# Patient Record
Sex: Female | Born: 1965 | Race: Black or African American | Hispanic: No | Marital: Married | Smoking: Former smoker
Health system: Southern US, Community
[De-identification: ages and names within clinical notes are randomized; demographics above are authoritative.]

## PROBLEM LIST (undated history)

## (undated) DIAGNOSIS — I10 Essential (primary) hypertension: Secondary | ICD-10-CM

## (undated) DIAGNOSIS — R7301 Impaired fasting glucose: Secondary | ICD-10-CM

## (undated) DIAGNOSIS — E079 Disorder of thyroid, unspecified: Secondary | ICD-10-CM

## (undated) DIAGNOSIS — R920 Mammographic microcalcification found on diagnostic imaging of breast: Secondary | ICD-10-CM

## (undated) DIAGNOSIS — E785 Hyperlipidemia, unspecified: Secondary | ICD-10-CM

## (undated) HISTORY — PX: BREAST EXCISIONAL BIOPSY: SUR124

## (undated) HISTORY — DX: Impaired fasting glucose: R73.01

## (undated) HISTORY — DX: Disorder of thyroid, unspecified: E07.9

## (undated) HISTORY — DX: Hyperlipidemia, unspecified: E78.5

## (undated) HISTORY — DX: Essential (primary) hypertension: I10

## (undated) HISTORY — PX: OTHER SURGICAL HISTORY: SHX169

## (undated) HISTORY — DX: Mammographic microcalcification found on diagnostic imaging of breast: R92.0

---

## 2002-12-14 DIAGNOSIS — I10 Essential (primary) hypertension: Secondary | ICD-10-CM

## 2002-12-14 HISTORY — DX: Essential (primary) hypertension: I10

## 2010-12-14 DIAGNOSIS — R920 Mammographic microcalcification found on diagnostic imaging of breast: Secondary | ICD-10-CM

## 2010-12-14 HISTORY — DX: Mammographic microcalcification found on diagnostic imaging of breast: R92.0

## 2011-11-02 ENCOUNTER — Ambulatory Visit: Payer: Self-pay | Admitting: Nephrology

## 2011-11-18 ENCOUNTER — Ambulatory Visit: Payer: Self-pay | Admitting: Nephrology

## 2012-06-07 ENCOUNTER — Ambulatory Visit: Payer: Self-pay | Admitting: General Surgery

## 2012-12-21 ENCOUNTER — Ambulatory Visit: Payer: Self-pay | Admitting: General Surgery

## 2013-04-17 ENCOUNTER — Encounter: Payer: Self-pay | Admitting: *Deleted

## 2013-06-28 ENCOUNTER — Ambulatory Visit: Payer: Self-pay | Admitting: General Surgery

## 2013-07-06 ENCOUNTER — Ambulatory Visit: Payer: Self-pay | Admitting: General Surgery

## 2013-07-20 ENCOUNTER — Ambulatory Visit: Payer: Self-pay | Admitting: General Surgery

## 2013-07-24 ENCOUNTER — Encounter: Payer: Self-pay | Admitting: General Surgery

## 2013-07-24 ENCOUNTER — Ambulatory Visit: Payer: Self-pay | Admitting: General Surgery

## 2013-08-07 ENCOUNTER — Ambulatory Visit: Payer: Self-pay | Admitting: General Surgery

## 2014-10-15 ENCOUNTER — Encounter: Payer: Self-pay | Admitting: *Deleted

## 2014-11-06 ENCOUNTER — Ambulatory Visit: Payer: Self-pay

## 2015-07-06 ENCOUNTER — Other Ambulatory Visit: Payer: Self-pay | Admitting: Unknown Physician Specialty

## 2015-09-20 ENCOUNTER — Other Ambulatory Visit: Payer: Self-pay | Admitting: Unknown Physician Specialty

## 2015-09-20 NOTE — Telephone Encounter (Deleted)
I need a pharmacy for this

## 2015-09-25 ENCOUNTER — Other Ambulatory Visit: Payer: Self-pay

## 2015-09-25 DIAGNOSIS — E038 Other specified hypothyroidism: Secondary | ICD-10-CM

## 2015-09-25 DIAGNOSIS — R7301 Impaired fasting glucose: Secondary | ICD-10-CM | POA: Insufficient documentation

## 2015-09-25 DIAGNOSIS — I129 Hypertensive chronic kidney disease with stage 1 through stage 4 chronic kidney disease, or unspecified chronic kidney disease: Secondary | ICD-10-CM | POA: Insufficient documentation

## 2015-09-25 DIAGNOSIS — I10 Essential (primary) hypertension: Secondary | ICD-10-CM

## 2015-09-25 DIAGNOSIS — E785 Hyperlipidemia, unspecified: Secondary | ICD-10-CM

## 2015-09-25 DIAGNOSIS — E669 Obesity, unspecified: Secondary | ICD-10-CM

## 2015-09-25 DIAGNOSIS — E039 Hypothyroidism, unspecified: Secondary | ICD-10-CM

## 2015-09-25 DIAGNOSIS — E1122 Type 2 diabetes mellitus with diabetic chronic kidney disease: Secondary | ICD-10-CM | POA: Insufficient documentation

## 2015-09-25 DIAGNOSIS — Z780 Asymptomatic menopausal state: Secondary | ICD-10-CM | POA: Insufficient documentation

## 2015-09-25 NOTE — Telephone Encounter (Signed)
Patient has appointment scheduled for 09/27/15 and is not due for a refill on the medication that was requested until May 2017.

## 2015-09-25 NOTE — Telephone Encounter (Signed)
Called and left patient a voicemail because we got a refill request from [facility] but I do not have that as her preferred pharmacy. So I called to see which one she uses. Asked patient to please call me back to let me know.

## 2015-09-27 ENCOUNTER — Encounter: Payer: Self-pay | Admitting: Unknown Physician Specialty

## 2015-09-27 ENCOUNTER — Ambulatory Visit (INDEPENDENT_AMBULATORY_CARE_PROVIDER_SITE_OTHER): Payer: Managed Care, Other (non HMO) | Admitting: Unknown Physician Specialty

## 2015-09-27 VITALS — BP 142/90 | HR 67 | Temp 98.1°F | Ht 62.72 in | Wt 194.2 lb

## 2015-09-27 DIAGNOSIS — I10 Essential (primary) hypertension: Secondary | ICD-10-CM | POA: Diagnosis not present

## 2015-09-27 DIAGNOSIS — E669 Obesity, unspecified: Secondary | ICD-10-CM | POA: Diagnosis not present

## 2015-09-27 DIAGNOSIS — R7301 Impaired fasting glucose: Secondary | ICD-10-CM

## 2015-09-27 DIAGNOSIS — N951 Menopausal and female climacteric states: Secondary | ICD-10-CM | POA: Diagnosis not present

## 2015-09-27 DIAGNOSIS — Z0000 Encounter for general adult medical examination without abnormal findings: Secondary | ICD-10-CM | POA: Diagnosis not present

## 2015-09-27 LAB — BAYER DCA HB A1C WAIVED: HB A1C (BAYER DCA - WAIVED): 6.3 % (ref <7.0)

## 2015-09-27 LAB — MICROALBUMIN, URINE WAIVED
Creatinine, Urine Waived: 50 mg/dL (ref 10–300)
MICROALB, UR WAIVED: 10 mg/L (ref 0–19)
Microalb/Creat Ratio: <30 mg/g (ref <30)

## 2015-09-27 NOTE — Assessment & Plan Note (Addendum)
Borderline high today.  Will work on lifestyle changes

## 2015-09-27 NOTE — Assessment & Plan Note (Addendum)
Weight watchers.  Discussed exercise.  Handout HITT training.  Form filled out for [employer] for a 15 pound weight loss in 6 months

## 2015-09-27 NOTE — Progress Notes (Signed)
BP 142/90 mmHg  Pulse 67  Temp(Src) 98.1 F (36.7 C)  Ht 5' 2.72" (1.593 m)  Wt 194 lb 3.2 oz (88.089 kg)  BMI 34.71 kg/m2  SpO2 100%  LMP 09/13/2015   Subjective:    Patient ID: Sherry Patel, female    DOB: 06/30/1966, 49 y.o.   MRN: [id number]  HPI: Sherry Patel is a 49 y.o. female  Chief Complaint  Patient presents with  . Annual Exam   Hypertension This is a chronic (High today.  Was rushed and doesn't check in on a regular basis) problem. Associated symptoms include malaise/fatigue. Pertinent negatives include no anxiety, blurred vision, chest pain, headaches, neck pain, orthopnea, palpitations, peripheral edema, PND, shortness of breath or sweats. There are no compliance problems.   Hyperlipidemia This is a chronic problem. There are no known factors aggravating her hyperlipidemia. Pertinent negatives include no chest pain or shortness of breath. The current treatment provides no improvement of lipids. There are no compliance problems.    IFG/obesity Recent weight gain.  Planning on going to weight watchers.  Has a DASH eating plan but needs to look at it.     Relevant past medical, surgical, family and social history reviewed and updated as indicated. Interim medical history since our last visit reviewed. Allergies and medications reviewed and updated.  Review of Systems  Constitutional: Positive for malaise/fatigue.  Eyes: Negative for blurred vision.  Respiratory: Negative for shortness of breath.   Cardiovascular: Negative for chest pain, palpitations, orthopnea and PND.  Genitourinary:       Periods off and on.  Peri-menopausal symptoms.  Musculoskeletal: Negative for neck pain.  Neurological: Negative for headaches.  Psychiatric/Behavioral: Negative.    Depression screen [facility] 2/9 09/27/2015  Decreased Interest 0  Down, Depressed, Hopeless 0  PHQ - 2 Score 0     Per HPI unless specifically indicated above     Objective:    BP 142/90 mmHg  Pulse  67  Temp(Src) 98.1 F (36.7 C)  Ht 5' 2.72" (1.593 m)  Wt 194 lb 3.2 oz (88.089 kg)  BMI 34.71 kg/m2  SpO2 100%  LMP 09/13/2015  Wt Readings from Last 3 Encounters:  09/27/15 194 lb 3.2 oz (88.089 kg)    Physical Exam  Constitutional: She is oriented to person, place, and time. She appears well-developed and well-nourished.  HENT:  Head: Normocephalic and atraumatic.  Eyes: Pupils are equal, round, and reactive to light. Right eye exhibits no discharge. Left eye exhibits no discharge. No scleral icterus.  Neck: Normal range of motion. Neck supple. Carotid bruit is not present. No thyromegaly present.  Cardiovascular: Normal rate, regular rhythm and normal heart sounds.  Exam reveals no gallop and no friction rub.   No murmur heard. Pulmonary/Chest: Effort normal and breath sounds normal. No respiratory distress. She has no wheezes. She has no rales.  Abdominal: Soft. Bowel sounds are normal. There is no tenderness. There is no rebound.  Genitourinary: No breast swelling, tenderness or discharge.  Musculoskeletal: Normal range of motion.  Lymphadenopathy:    She has no cervical adenopathy.  Neurological: She is alert and oriented to person, place, and time.  Skin: Skin is warm, dry and intact. No rash noted.  Psychiatric: She has a normal mood and affect. Her speech is normal and behavior is normal. Judgment and thought content normal. Cognition and memory are normal.    No results found for this or any previous visit.    Assessment & Plan:  Problem List Items Addressed This Visit      Unprioritized   Hypertension - Primary    Borderline high today.  Will work on lifestyle changes      Relevant Orders   Comprehensive metabolic panel   Microalbumin, Urine Waived   Uric acid   IFG (impaired fasting glucose)   Relevant Orders   Bayer DCA Hb A1c Waived   Obesity    Weight watchers.  Discussed exercise.  Handout HITT training      Relevant Orders   Bayer DCA Hb A1c  Waived   Peri-menopause    Other Visit Diagnoses    Routine general medical examination at a health care facility        Relevant Orders    CBC with Differential/Platelet    Comprehensive metabolic panel    Hepatitis C antibody    HIV antibody    Lipid Panel w/o Chol/HDL Ratio    TSH    Bayer DCA Hb A1c Waived    Ambulatory referral to Gastroenterology        Follow up plan: Return in about 3 months (around 12/28/2015).

## 2015-09-27 NOTE — Patient Instructions (Signed)
Think you're too busy to work out? We have the workout for you. In minutes, high-intensity interval training (H.I.I.T.) will have you sweating, breathing hard and maximizing the health benefits of exercise without the time commitment. Best of all, it's scientifically proven to work.  What Is H.I.I.T.? SHORT WORKOUTS 101 High-intensity interval training - referred to as H.I.I.T. - is based on the idea that short bursts of strenuous exercise can have a big impact on the body. If moderate exercise - like a 20-minute jog - is good for your heart, lungs and metabolism, H.I.I.T. packs the benefits of that workout and more into a few minutes. It may sound too good to be true, but learning this exercise technique and adapting it to your life can mean saving hours at the gym. If you think you don't have time to exercise, H.I.I.T. may be the workout for you.  You can try it with any aerobic activity you like. The principles of H.I.I.T. can be applied to running, biking, stair climbing, swimming, jumping rope, rowing, even hopping or skipping. (Yes, skipping!)  The downside? Even though H.I.I.T. lasts only minutes, the workouts are tough, requiring you to push your body near its limit.  HOW INTENSE IS HIGH INTENSITY? High-intensity exercise is obviously not a casual stroll down the street, but it's not a run-till-your-lungs-pop explosion, either. Think breathless, not winded. Heart-pounding, not exploding. Legs pumping, but not uncontrolled.  You don't need any fancy heart rate monitors to do these workouts. Use cues from your body as a guide. In the middle of a high-intensity workout you should be able to say single words, but not complete whole sentences. So, if you can keep chatting to your workout partner during this workout, pump it up a few notches.  10-02-29 Training This simple program will help you make the most of a short workout by improving heart health and endurance. Try it with your favorite  cardiovascular activity. The essentials of 10-02-29 training are simple. Run, ride or perhaps row on a rowing machine gently for 30 seconds, accelerate to a moderate pace for 20 seconds, then sprint as hard as you can for 10 seconds. (It should be called 30-20-10 training, obviously, but that is not as catchy.) Repeat.  You don't even need a stopwatch to monitor the 30-, 20-, and 10-second time changes. You can just count to yourself, which seems to make the intervals pass more quickly.  Best of all? The grueling, all-out portion of the workout lasts for only 10 seconds. C'mon, you can do anything for 10 seconds, right?  Got 10 Minutes? A solitary minute of hard work buried in 10 minutes of activity can make a big difference.  The 10-Minute Workout If you like to run, bike, row or swim - just a little bit - this workout is a great option for you. Step 1 Warm up for 2 minutes Step 2 Pedal, run or swim all-out for 20 seconds. Repeat 2 more times Warm down for 3 minutes    GET STARTED To benefit the most from really, really short workouts, you need to build the habit of doing them into your hectic life. Ideally, you'll complete the workout three times a week. The best way to build that habit is to start small and be willing to tweak your schedule where you can to accommodate your new workout.  First set up a spot in your house for your workout, equipped with whatever you need to get the job done: sneakers, a   chair, a towel, etc. Then slot your workout in before you would normally shower. (You can even do it in the bathroom.) Or wake up five minutes earlier and do it first thing in the morning, so you can head off to work feeling accomplished. Or do it during your lunch hour. Run up your office's stairs or grab a private conference room for just a few minutes. Or work it into your commute. If you walk or bike to work, add some heavy intervals on the way home.  GET A BOOST FROM MUSIC Creating a  workout playlist of high-energy tunes you love will not make your workout feel easier, but it may cause you to exercise harder without even realizing it. Best of all, if you are doing a really short workout, you need only one or two great tunes to get you through. If you are willing to try something a bit different, make your own music as you exercise. Sing, hum, clap your hands, whatever you can do to jam along to your playlist. It may give you an extra boost to finish strong.  Find a song or podcast that's the length of your really, really short workout. By the time the song is over, you're done.  Excerpted from the [place] Times Well column http://www.nytimes.com/well/guides/really-really-short-workouts?smid=fb-nytwell&smtyp=pay  

## 2015-09-28 LAB — CBC WITH DIFFERENTIAL/PLATELET
BASOS ABS: 0 10*3/uL (ref 0.0–0.2)
Basos: 0 %
EOS (ABSOLUTE): 0.2 10*3/uL (ref 0.0–0.4)
Eos: 2 %
HEMOGLOBIN: 12.4 g/dL (ref 11.1–15.9)
Hematocrit: 37.9 % (ref 34.0–46.6)
Immature Grans (Abs): 0 10*3/uL (ref 0.0–0.1)
Immature Granulocytes: 0 %
LYMPHS ABS: 2.6 10*3/uL (ref 0.7–3.1)
Lymphs: 32 %
MCH: 28.8 pg (ref 26.6–33.0)
MCHC: 32.7 g/dL (ref 31.5–35.7)
MCV: 88 fL (ref 79–97)
MONOCYTES: 6 %
MONOS ABS: 0.5 10*3/uL (ref 0.1–0.9)
Neutrophils Absolute: 4.8 10*3/uL (ref 1.4–7.0)
Neutrophils: 60 %
PLATELETS: 381 10*3/uL — AB (ref 150–379)
RBC: 4.3 x10E6/uL (ref 3.77–5.28)
RDW: 14.9 % (ref 12.3–15.4)
WBC: 8.1 10*3/uL (ref 3.4–10.8)

## 2015-09-28 LAB — COMPREHENSIVE METABOLIC PANEL
ALK PHOS: 73 IU/L (ref 39–117)
ALT: 12 IU/L (ref 0–32)
AST: 13 IU/L (ref 0–40)
Albumin/Globulin Ratio: 1.2 (ref 1.1–2.5)
Albumin: 4.3 g/dL (ref 3.5–5.5)
BILIRUBIN TOTAL: 0.3 mg/dL (ref 0.0–1.2)
BUN/Creatinine Ratio: 17 (ref 9–23)
BUN: 10 mg/dL (ref 6–24)
CHLORIDE: 97 mmol/L (ref 97–108)
CO2: 23 mmol/L (ref 18–29)
CREATININE: 0.59 mg/dL (ref 0.57–1.00)
Calcium: 10 mg/dL (ref 8.7–10.2)
GFR calc Af Amer: 123 mL/min/{1.73_m2} (ref >59)
GFR calc non Af Amer: 106 mL/min/{1.73_m2} (ref >59)
GLUCOSE: 80 mg/dL (ref 65–99)
Globulin, Total: 3.5 g/dL (ref 1.5–4.5)
Potassium: 4.2 mmol/L (ref 3.5–5.2)
Sodium: 139 mmol/L (ref 134–144)
Total Protein: 7.8 g/dL (ref 6.0–8.5)

## 2015-09-28 LAB — LIPID PANEL W/O CHOL/HDL RATIO
Cholesterol, Total: 194 mg/dL (ref 100–199)
HDL: 49 mg/dL (ref >39)
LDL Calculated: 127 mg/dL — ABNORMAL HIGH (ref 0–99)
TRIGLYCERIDES: 88 mg/dL (ref 0–149)
VLDL Cholesterol Cal: 18 mg/dL (ref 5–40)

## 2015-09-28 LAB — TSH: TSH: 8.56 u[IU]/mL — ABNORMAL HIGH (ref 0.450–4.500)

## 2015-09-28 LAB — URIC ACID: URIC ACID: 6.6 mg/dL (ref 2.5–7.1)

## 2015-09-28 LAB — HEPATITIS C ANTIBODY: HEP C VIRUS AB: 0.2 {s_co_ratio} (ref 0.0–0.9)

## 2015-09-28 LAB — HIV ANTIBODY (ROUTINE TESTING W REFLEX): HIV SCREEN 4TH GENERATION: NONREACTIVE

## 2015-10-01 ENCOUNTER — Other Ambulatory Visit: Payer: Self-pay | Admitting: Unknown Physician Specialty

## 2015-10-01 DIAGNOSIS — E039 Hypothyroidism, unspecified: Secondary | ICD-10-CM

## 2015-10-01 MED ORDER — LEVOTHYROXINE SODIUM 50 MCG PO TABS: 50.0000 ug | ORAL_TABLET | Freq: Every day | ORAL | Status: DC

## 2015-10-03 ENCOUNTER — Telehealth: Payer: Self-pay | Admitting: Unknown Physician Specialty

## 2015-10-03 MED ORDER — LEVOTHYROXINE SODIUM 50 MCG PO TABS: 50.0000 ug | ORAL_TABLET | Freq: Every day | ORAL | Status: DC

## 2015-10-03 NOTE — Telephone Encounter (Signed)
Pt calls and stated that [facility] has not received her rx for her synthroid and she called to verify we have the right fax number([phone])

## 2015-10-03 NOTE — Telephone Encounter (Signed)
Routing to provider. Can you e-prescribe this since you are not here to sign for it? Pharmacy is [facility].

## 2015-10-04 NOTE — Telephone Encounter (Signed)
Rx was printed again but Elnita MaxwellCheryl is not here to sign it. So I called the rx into the pharmacy.

## 2015-10-28 ENCOUNTER — Other Ambulatory Visit: Payer: Self-pay

## 2015-10-28 MED ORDER — AMLODIPINE BESYLATE 2.5 MG PO TABS: 2.5000 mg | ORAL_TABLET | Freq: Every day | ORAL | Status: DC

## 2015-10-28 NOTE — Telephone Encounter (Signed)
LAST VISIT: 09/27/2015 NEXT APPT: 12/31/2015  Request for Amlodipine 2.5 mg

## 2015-11-22 ENCOUNTER — Other Ambulatory Visit: Payer: Self-pay | Admitting: Unknown Physician Specialty

## 2015-12-31 ENCOUNTER — Ambulatory Visit (INDEPENDENT_AMBULATORY_CARE_PROVIDER_SITE_OTHER): Payer: Managed Care, Other (non HMO) | Admitting: Unknown Physician Specialty

## 2015-12-31 ENCOUNTER — Encounter: Payer: Self-pay | Admitting: Unknown Physician Specialty

## 2015-12-31 VITALS — BP 132/87 | HR 54 | Temp 98.2°F | Ht 62.1 in | Wt 198.2 lb

## 2015-12-31 DIAGNOSIS — I10 Essential (primary) hypertension: Secondary | ICD-10-CM

## 2015-12-31 DIAGNOSIS — E039 Hypothyroidism, unspecified: Secondary | ICD-10-CM | POA: Diagnosis not present

## 2015-12-31 NOTE — Progress Notes (Signed)
BP 132/87 mmHg  Pulse 54  Temp(Src) 98.2 F (36.8 C)  Ht 5' 2.1" (1.577 m)  Wt 198 lb 3.2 oz (89.903 kg)  BMI 36.15 kg/m2  SpO2 100%  LMP 10/31/2015 (Approximate)   Subjective:    Patient ID: Sherry Patel, female    DOB: 03/24/1966, 50 y.o.   MRN: [id number]  HPI: Sherry Patel is a 50 y.o. female  Chief Complaint  Patient presents with  . Hypertension  . Hyperlipidemia  . Hypothyroidism   Hypothyroid Strted thyroid meds after last labs.  Fatigue seems to be better.  Struggling with weight and unable to lose.  Thinks her thyroid medicine isn't working for her metabolism.  Hypertension Better today than in the past.    Relevant past medical, surgical, family and social history reviewed and updated as indicated. Interim medical history since our last visit reviewed. Allergies and medications reviewed and updated.  Review of Systems  Per HPI unless specifically indicated above     Objective:    BP 132/87 mmHg  Pulse 54  Temp(Src) 98.2 F (36.8 C)  Ht 5' 2.1" (1.577 m)  Wt 198 lb 3.2 oz (89.903 kg)  BMI 36.15 kg/m2  SpO2 100%  LMP 10/31/2015 (Approximate)  Wt Readings from Last 3 Encounters:  12/31/15 198 lb 3.2 oz (89.903 kg)  09/27/15 194 lb 3.2 oz (88.089 kg)    Physical Exam  Constitutional: She is oriented to person, place, and time. She appears well-developed and well-nourished. No distress.  HENT:  Head: Normocephalic and atraumatic.  Eyes: Conjunctivae and lids are normal. Right eye exhibits no discharge. Left eye exhibits no discharge. No scleral icterus.  Neck: Normal range of motion. Neck supple. No JVD present. Carotid bruit is not present.  Cardiovascular: Normal rate, regular rhythm and normal heart sounds.   Pulmonary/Chest: Effort normal and breath sounds normal.  Abdominal: Normal appearance. There is no splenomegaly or hepatomegaly.  Musculoskeletal: Normal range of motion.  Neurological: She is alert and oriented to person, place, and  time.  Skin: Skin is warm, dry and intact. No rash noted. No pallor.  Psychiatric: She has a normal mood and affect. Her behavior is normal. Judgment and thought content normal.    Results for orders placed or performed in visit on 09/27/15  CBC with Differential/Platelet  Result Value Ref Range   WBC 8.1 3.4 - 10.8 x10E3/uL   RBC 4.30 3.77 - 5.28 x10E6/uL   Hemoglobin 12.4 11.1 - 15.9 g/dL   Hematocrit 52.8 41.3 - 46.6 %   MCV 88 79 - 97 fL   MCH 28.8 26.6 - 33.0 pg   MCHC 32.7 31.5 - 35.7 g/dL   RDW 24.4 01.0 - 27.2 %   Platelets 381 (H) 150 - 379 x10E3/uL   Neutrophils 60 %   Lymphs 32 %   Monocytes 6 %   Eos 2 %   Basos 0 %   Neutrophils Absolute 4.8 1.4 - 7.0 x10E3/uL   Lymphocytes Absolute 2.6 0.7 - 3.1 x10E3/uL   Monocytes Absolute 0.5 0.1 - 0.9 x10E3/uL   EOS (ABSOLUTE) 0.2 0.0 - 0.4 x10E3/uL   Basophils Absolute 0.0 0.0 - 0.2 x10E3/uL   Immature Granulocytes 0 %   Immature Grans (Abs) 0.0 0.0 - 0.1 x10E3/uL  Comprehensive metabolic panel  Result Value Ref Range   Glucose 80 65 - 99 mg/dL   BUN 10 6 - 24 mg/dL   Creatinine, Ser 5.36 0.57 - 1.00 mg/dL   GFR calc  non Af Amer 106 >59 mL/min/1.73   GFR calc Af Amer 123 >59 mL/min/1.73   BUN/Creatinine Ratio 17 9 - 23   Sodium 139 134 - 144 mmol/L   Potassium 4.2 3.5 - 5.2 mmol/L   Chloride 97 97 - 108 mmol/L   CO2 23 18 - 29 mmol/L   Calcium 10.0 8.7 - 10.2 mg/dL   Total Protein 7.8 6.0 - 8.5 g/dL   Albumin 4.3 3.5 - 5.5 g/dL   Globulin, Total 3.5 1.5 - 4.5 g/dL   Albumin/Globulin Ratio 1.2 1.1 - 2.5   Bilirubin Total 0.3 0.0 - 1.2 mg/dL   Alkaline Phosphatase 73 39 - 117 IU/L   AST 13 0 - 40 IU/L   ALT 12 0 - 32 IU/L  Hepatitis C antibody  Result Value Ref Range   Hep C Virus Ab 0.2 0.0 - 0.9 s/co ratio  HIV antibody  Result Value Ref Range   HIV Screen 4th Generation wRfx Non Reactive Non Reactive  Lipid Panel w/o Chol/HDL Ratio  Result Value Ref Range   Cholesterol, Total 194 100 - 199 mg/dL    Triglycerides 88 0 - 149 mg/dL   HDL 49 >16 mg/dL   VLDL Cholesterol Cal 18 5 - 40 mg/dL   LDL Calculated 109 (H) 0 - 99 mg/dL  TSH  Result Value Ref Range   TSH 8.560 (H) 0.450 - 4.500 uIU/mL  Bayer DCA Hb A1c Waived  Result Value Ref Range   Bayer DCA Hb A1c Waived 6.3 <7.0 %  Microalbumin, Urine Waived  Result Value Ref Range   Microalb, Ur Waived 10 0 - 19 mg/L   Creatinine, Urine Waived 50 10 - 300 mg/dL   Microalb/Creat Ratio <30 <30 mg/g  Uric acid  Result Value Ref Range   Uric Acid 6.6 2.5 - 7.1 mg/dL      Assessment & Plan:   Problem List Items Addressed This Visit      Unprioritized   Hypertension - Primary    Stable today      Thyroid activity decreased    Check TSH today          Follow up plan: Return in about 3 months (around 03/30/2016).

## 2015-12-31 NOTE — Assessment & Plan Note (Signed)
Check TSH today

## 2015-12-31 NOTE — Assessment & Plan Note (Signed)
Stable today.

## 2016-01-01 ENCOUNTER — Telehealth: Payer: Self-pay | Admitting: Unknown Physician Specialty

## 2016-01-01 ENCOUNTER — Other Ambulatory Visit: Payer: Self-pay | Admitting: Unknown Physician Specialty

## 2016-01-01 LAB — TSH: TSH: 2.37 u[IU]/mL (ref 0.450–4.500)

## 2016-01-01 MED ORDER — LEVOTHYROXINE SODIUM 50 MCG PO TABS: 50.0000 ug | ORAL_TABLET | Freq: Every day | ORAL | Status: DC

## 2016-01-01 NOTE — Telephone Encounter (Signed)
Patient wants to know if she can add mental pause supplement with her medication and a multivitamin, please call patient back, thanks.

## 2016-01-01 NOTE — Telephone Encounter (Signed)
Called and spoke to patient. She states she is wanting to start taking a multivitamin and a supplement for menopause called [facility] if [facility] approves.

## 2016-01-01 NOTE — Telephone Encounter (Signed)
Routing to provider. Is it okay for patient to add medications?

## 2016-01-01 NOTE — Telephone Encounter (Signed)
I need to be sure of this message.  Does she mean menopause?  And I need to know what is the supplement

## 2016-01-02 NOTE — Telephone Encounter (Signed)
That would be fine!  Thanks! 

## 2016-01-02 NOTE — Telephone Encounter (Signed)
Called and let patient know that Sherry Patel said it would be okay for her to start taking the supplements.

## 2016-02-04 ENCOUNTER — Other Ambulatory Visit: Payer: Self-pay | Admitting: Unknown Physician Specialty

## 2016-02-04 MED ORDER — ATORVASTATIN CALCIUM 10 MG PO TABS: 10.0000 mg | ORAL_TABLET | Freq: Every day | ORAL | Status: DC

## 2016-02-04 NOTE — Telephone Encounter (Signed)
metoprolol succinate (TOPROL-XL) 100 MG 24 hr tablet  Patient needs refill on her medication sent to [facility] [address].

## 2016-02-04 NOTE — Telephone Encounter (Signed)
Pt also needs atorvastatin sent to [facility] [address]

## 2016-02-04 NOTE — Telephone Encounter (Signed)
Called and spoke to patient. She states she needs a short amount of her atorvastatin and metoprolol sent to [facility] until her mail order prescriptions come in.

## 2016-02-04 NOTE — Telephone Encounter (Signed)
Called medications into pharmacy.

## 2016-02-04 NOTE — Telephone Encounter (Signed)
Please call these in if she wants [address].  For some reason the EMR is making me send them to [facility].

## 2016-02-05 ENCOUNTER — Telehealth: Payer: Self-pay | Admitting: Unknown Physician Specialty

## 2016-02-05 NOTE — Telephone Encounter (Signed)
That should be OK

## 2016-02-05 NOTE — Telephone Encounter (Signed)
Patient needs to talk to [facility] or Elnita Maxwell regarding medications. She has questions about what she is taking and wants to take. Please call patient at [phone] or work [phone], thanks.

## 2016-02-05 NOTE — Telephone Encounter (Signed)
Called and let patient know that Elnita Maxwell said it would be ok for her to take the supplement.

## 2016-02-05 NOTE — Telephone Encounter (Signed)
Called and spoke to patient. She wants to know if she can take a supplement called [facility] for premenopausal symptoms.

## 2016-03-19 ENCOUNTER — Other Ambulatory Visit: Payer: Self-pay | Admitting: Unknown Physician Specialty

## 2016-03-31 ENCOUNTER — Ambulatory Visit: Payer: Managed Care, Other (non HMO) | Admitting: Unknown Physician Specialty

## 2016-04-03 ENCOUNTER — Encounter: Payer: Self-pay | Admitting: Unknown Physician Specialty

## 2016-04-03 ENCOUNTER — Ambulatory Visit (INDEPENDENT_AMBULATORY_CARE_PROVIDER_SITE_OTHER): Payer: Managed Care, Other (non HMO) | Admitting: Unknown Physician Specialty

## 2016-04-03 VITALS — BP 124/80 | HR 59 | Temp 98.3°F | Ht 62.0 in | Wt 198.8 lb

## 2016-04-03 DIAGNOSIS — E785 Hyperlipidemia, unspecified: Secondary | ICD-10-CM

## 2016-04-03 DIAGNOSIS — E039 Hypothyroidism, unspecified: Secondary | ICD-10-CM

## 2016-04-03 DIAGNOSIS — I10 Essential (primary) hypertension: Secondary | ICD-10-CM | POA: Diagnosis not present

## 2016-04-03 MED ORDER — ATORVASTATIN CALCIUM 10 MG PO TABS: 10.0000 mg | ORAL_TABLET | Freq: Every day | ORAL | Status: DC

## 2016-04-03 MED ORDER — LEVOTHYROXINE SODIUM 50 MCG PO TABS: 50.0000 ug | ORAL_TABLET | Freq: Every day | ORAL | Status: DC

## 2016-04-03 NOTE — Progress Notes (Signed)
   BP 124/80 mmHg  Pulse 59  Temp(Src) 98.3 F (36.8 C)  Ht 5\' 2"  (1.575 m)  Wt 198 lb 12.8 oz (90.175 kg)  BMI 36.35 kg/m2  SpO2 98%  LMP  (LMP Unknown)   Subjective:    Patient ID: Sherry Patel, female    DOB: [birth year], 50 y.o.   MRN: [id number]  HPI: Sherry Patel is a 50 y.o. female  Chief Complaint  Patient presents with  . Hyperlipidemia  . Hypertension  . Hypothyroidism  . Medication Refill    pt states she needs levothyroxine sent to [facility]   Hypertension Using medications without difficulty   No problems or lightheadedness No chest pain with exertion or shortness of breath No Edema   Hyperlipidemia Using medications without problems No Muscle aches  Diet compliance: "starting over again" Exercise: as above  Hypothyroid Euthyroid on 50 mcgs of Synthroid  Relevant past medical, surgical, family and social history reviewed and updated as indicated. Interim medical history since our last visit reviewed. Allergies and medications reviewed and updated.  Review of Systems  Per HPI unless specifically indicated above     Objective:    BP 124/80 mmHg  Pulse 59  Temp(Src) 98.3 F (36.8 C)  Ht 5\' 2"  (1.575 m)  Wt 198 lb 12.8 oz (90.175 kg)  BMI 36.35 kg/m2  SpO2 98%  LMP  (LMP Unknown)  Wt Readings from Last 3 Encounters:  04/03/16 198 lb 12.8 oz (90.175 kg)  12/31/15 198 lb 3.2 oz (89.903 kg)  09/27/15 194 lb 3.2 oz (88.089 kg)    Physical Exam  Constitutional: She is oriented to person, place, and time. She appears well-developed and well-nourished. No distress.  HENT:  Head: Normocephalic and atraumatic.  Eyes: Conjunctivae and lids are normal. Right eye exhibits no discharge. Left eye exhibits no discharge. No scleral icterus.  Neck: Normal range of motion. Neck supple. No JVD present. Carotid bruit is not present.  Cardiovascular: Normal rate, regular rhythm and normal heart sounds.   Pulmonary/Chest: Effort normal and breath sounds normal.   Abdominal: Normal appearance. There is no splenomegaly or hepatomegaly.  Musculoskeletal: Normal range of motion.  Neurological: She is alert and oriented to person, place, and time.  Skin: Skin is warm, dry and intact. No rash noted. No pallor.  Psychiatric: She has a normal mood and affect. Her behavior is normal. Judgment and thought content normal.    Results for orders placed or performed in visit on 12/31/15  TSH  Result Value Ref Range   TSH 2.370 0.450 - 4.500 uIU/mL      Assessment & Plan:   Problem List Items Addressed This Visit      Unprioritized   Hyperlipidemia   Relevant Medications   atorvastatin (LIPITOR) 10 MG tablet   Hypertension - Primary   Relevant Medications   atorvastatin (LIPITOR) 10 MG tablet   Thyroid activity decreased   Relevant Medications   levothyroxine (SYNTHROID, LEVOTHROID) 50 MCG tablet      Diagnosis stable.  Continue present medications  Follow up plan: Return in about 6 months (around 10/03/2016).

## 2016-05-14 ENCOUNTER — Telehealth: Payer: Self-pay | Admitting: Unknown Physician Specialty

## 2016-05-14 MED ORDER — AMLODIPINE BESYLATE 2.5 MG PO TABS: 2.5000 mg | ORAL_TABLET | Freq: Every day | ORAL | Status: DC

## 2016-05-14 NOTE — Telephone Encounter (Signed)
Pt needs 2 week supply of Amlodipine called into [facility] [address]  This is to cover until she gets her mail order arrives.

## 2016-10-02 ENCOUNTER — Encounter: Payer: Self-pay | Admitting: Unknown Physician Specialty

## 2016-10-02 ENCOUNTER — Ambulatory Visit (INDEPENDENT_AMBULATORY_CARE_PROVIDER_SITE_OTHER): Payer: Managed Care, Other (non HMO) | Admitting: Unknown Physician Specialty

## 2016-10-02 VITALS — BP 136/93 | HR 57 | Temp 98.2°F | Ht 62.0 in | Wt 198.0 lb

## 2016-10-02 DIAGNOSIS — E7800 Pure hypercholesterolemia, unspecified: Secondary | ICD-10-CM

## 2016-10-02 DIAGNOSIS — I129 Hypertensive chronic kidney disease with stage 1 through stage 4 chronic kidney disease, or unspecified chronic kidney disease: Secondary | ICD-10-CM | POA: Diagnosis not present

## 2016-10-02 DIAGNOSIS — R7301 Impaired fasting glucose: Secondary | ICD-10-CM

## 2016-10-02 DIAGNOSIS — Z0000 Encounter for general adult medical examination without abnormal findings: Secondary | ICD-10-CM | POA: Diagnosis not present

## 2016-10-02 DIAGNOSIS — Z6839 Body mass index (BMI) 39.0-39.9, adult: Secondary | ICD-10-CM

## 2016-10-02 DIAGNOSIS — E6609 Other obesity due to excess calories: Secondary | ICD-10-CM

## 2016-10-02 NOTE — Patient Instructions (Signed)
Eat Fat Get Thin  The new Adkins diet

## 2016-10-02 NOTE — Progress Notes (Signed)
BP (!) 136/93   Pulse (!) 57   Temp 98.2 F (36.8 C)   Ht 5\' 2"  (1.575 m)   Wt 198 lb (89.8 kg)   BMI 36.21 kg/m    Subjective:    Patient ID: Sherry Patel, female    DOB: [birth year], 50 y.o.   MRN: [id number]  HPI: Sherry Patel is a 50 y.o. female  Chief Complaint  Patient presents with  . Follow-up    6 month check   Hypertension Using medications without difficulty                 No problems or lightheadedness No chest pain with exertion or shortness of breath No Edema   Hyperlipidemia Using medications without problems No Muscle aches  Diet compliance: "starting over again" Exercise: as above  Hypothyroid Euthyroid on 50 mcgs of Synthroid  Obesity Pt needs form filled out due to BMI.   She is on a keto diet now and has lost 7 pounds since last being weighed.    Diabetes Pt brought in labs from [facility] showing Hgb A1C of 6.6 which prompted her to change her diet.    Relevant past medical, surgical, family and social history reviewed and updated as indicated. Interim medical history since our last visit reviewed. Allergies and medications reviewed and updated.  Review of Systems  Constitutional: Negative.   HENT: Negative.   Eyes: Negative.   Respiratory: Negative.   Cardiovascular: Negative.   Gastrointestinal: Negative.   Endocrine: Negative.   Genitourinary: Negative.   Musculoskeletal: Negative.   Skin: Negative.   Allergic/Immunologic: Negative.   Neurological: Negative.   Hematological: Negative.   Psychiatric/Behavioral: Negative.     Per HPI unless specifically indicated above     Objective:    BP (!) 136/93   Pulse (!) 57   Temp 98.2 F (36.8 C)   Ht 5\' 2"  (1.575 m)   Wt 198 lb (89.8 kg)   BMI 36.21 kg/m   Wt Readings from Last 3 Encounters:  10/02/16 198 lb (89.8 kg)  04/03/16 198 lb 12.8 oz (90.2 kg)  12/31/15 198 lb 3.2 oz (89.9 kg)    Physical Exam  Constitutional: She is oriented to person, place, and time. She  appears well-developed and well-nourished.  HENT:  Head: Normocephalic and atraumatic.  Eyes: Pupils are equal, round, and reactive to light. Right eye exhibits no discharge. Left eye exhibits no discharge. No scleral icterus.  Neck: Normal range of motion. Neck supple. Carotid bruit is not present. No thyromegaly present.  Cardiovascular: Normal rate, regular rhythm and normal heart sounds.  Exam reveals no gallop and no friction rub.   No murmur heard. Pulmonary/Chest: Effort normal and breath sounds normal. No respiratory distress. She has no wheezes. She has no rales.  Abdominal: Soft. Bowel sounds are normal. There is no tenderness. There is no rebound.  Genitourinary: No breast swelling, tenderness or discharge.  Musculoskeletal: Normal range of motion.  Lymphadenopathy:    She has no cervical adenopathy.  Neurological: She is alert and oriented to person, place, and time.  Skin: Skin is warm, dry and intact. No rash noted.  Psychiatric: She has a normal mood and affect. Her speech is normal and behavior is normal. Judgment and thought content normal. Cognition and memory are normal.    Results for orders placed or performed in visit on 12/31/15  TSH  Result Value Ref Range   TSH 2.370 0.450 - 4.500 uIU/mL  Assessment & Plan:   Problem List Items Addressed This Visit      Unprioritized   Hyperlipidemia    Last LDL is 83 from [facility].  Continue present meds      Hypertensive CKD (chronic kidney disease)    Stable, continue present medications.        IFG (impaired fasting glucose)    Last Hgb A1C was 6.6%.  Recheck in 1 month      Obesity    Working on this with diet changes       Other Visit Diagnoses    Routine general medical examination at a health care facility    -  Primary   Relevant Orders   MM DIGITAL SCREENING BILATERAL   Annual physical exam           Follow up plan: Return in about 4 weeks (around 10/30/2016) for Hgb A1C, CMP and  TSH.

## 2016-10-02 NOTE — Assessment & Plan Note (Signed)
Last Hgb A1C was 6.6%.  Recheck in 1 month

## 2016-10-02 NOTE — Assessment & Plan Note (Addendum)
Working on this with diet changes

## 2016-10-02 NOTE — Assessment & Plan Note (Signed)
Stable, continue present medications.   

## 2016-10-02 NOTE — Assessment & Plan Note (Signed)
Last LDL is 83 from [facility].  Continue present meds

## 2016-10-12 ENCOUNTER — Other Ambulatory Visit: Payer: Self-pay | Admitting: Unknown Physician Specialty

## 2016-10-12 ENCOUNTER — Telehealth: Payer: Self-pay | Admitting: Unknown Physician Specialty

## 2016-10-12 MED ORDER — LEVOTHYROXINE SODIUM 50 MCG PO TABS: 50.0000 ug | ORAL_TABLET | Freq: Every day | ORAL | 0 refills | Status: DC

## 2016-10-12 NOTE — Telephone Encounter (Signed)
Routing to provider  

## 2016-10-12 NOTE — Telephone Encounter (Signed)
Pt called stated she is waiting on her mail order pharmacy to send her RX's but has ran out of her Synthroid. Would like to know if some can be called in to last until her mail order meds arrive. Pharm is [facility] on [address]. Please send ASAP. Thanks.

## 2016-10-30 ENCOUNTER — Ambulatory Visit: Payer: Managed Care, Other (non HMO) | Admitting: Unknown Physician Specialty

## 2016-11-03 ENCOUNTER — Ambulatory Visit (INDEPENDENT_AMBULATORY_CARE_PROVIDER_SITE_OTHER): Payer: Managed Care, Other (non HMO) | Admitting: Unknown Physician Specialty

## 2016-11-03 ENCOUNTER — Encounter: Payer: Self-pay | Admitting: Unknown Physician Specialty

## 2016-11-03 VITALS — BP 131/85 | HR 83 | Temp 98.4°F | Ht 63.3 in | Wt 197.2 lb

## 2016-11-03 DIAGNOSIS — R7301 Impaired fasting glucose: Secondary | ICD-10-CM

## 2016-11-03 DIAGNOSIS — I10 Essential (primary) hypertension: Secondary | ICD-10-CM

## 2016-11-03 LAB — BAYER DCA HB A1C WAIVED: HB A1C: 6.3 % (ref <7.0)

## 2016-11-03 NOTE — Assessment & Plan Note (Signed)
Stable, continue present medications.   

## 2016-11-03 NOTE — Progress Notes (Signed)
BP 131/85 (BP Location: Left Arm, Patient Position: Sitting, Cuff Size: Large)   Pulse 83   Temp 98.4 F (36.9 C)   Ht 5' 3.3" (1.608 m) Comment: pt had boots on  Wt 197 lb 3.2 oz (89.4 kg) Comment: pt had boots on  SpO2 94%   BMI 34.60 kg/m    Subjective:    Patient ID: Sherry Patel, female    DOB: 1966/01/20, 50 y.o.   MRN: [id number]  HPI: Sherry Patel is a 50 y.o. female  Chief Complaint  Patient presents with  . Hypertension  . Hypothyroidism  . Labs Only   Diabetes: Had a higher Hgb A1C at [facility].  She did not go to the [facility] and has been doing on-line classes.   No hypoglycemic episodes Blood Sugars averaging:Not checking eye exam within last year Last Hgb A1C:6.7  Hypertension  Using medications without difficulty Average home BPs "less than here"  Using medication without problems or lightheadedness No chest pain with exertion or shortness of breath No Edema  Elevated Cholesterol Using medications without problems No Muscle aches  Diet: more lean meat and vegetables and cutting out sweets Exercise:walking/treadmill/elliptical   Relevant past medical, surgical, family and social history reviewed and updated as indicated. Interim medical history since our last visit reviewed. Allergies and medications reviewed and updated.  Review of Systems  Per HPI unless specifically indicated above     Objective:    BP 131/85 (BP Location: Left Arm, Patient Position: Sitting, Cuff Size: Large)   Pulse 83   Temp 98.4 F (36.9 C)   Ht 5' 3.3" (1.608 m) Comment: pt had boots on  Wt 197 lb 3.2 oz (89.4 kg) Comment: pt had boots on  SpO2 94%   BMI 34.60 kg/m   Wt Readings from Last 3 Encounters:  11/03/16 197 lb 3.2 oz (89.4 kg)  10/02/16 198 lb (89.8 kg)  04/03/16 198 lb 12.8 oz (90.2 kg)    Physical Exam  Constitutional: She is oriented to person, place, and time. She appears well-developed and well-nourished. No distress.  HENT:  Head:  Normocephalic and atraumatic.  Eyes: Conjunctivae and lids are normal. Right eye exhibits no discharge. Left eye exhibits no discharge. No scleral icterus.  Neck: Normal range of motion. Neck supple. No JVD present. Carotid bruit is not present.  Cardiovascular: Normal rate, regular rhythm and normal heart sounds.   Pulmonary/Chest: Effort normal and breath sounds normal.  Abdominal: Normal appearance. There is no splenomegaly or hepatomegaly.  Musculoskeletal: Normal range of motion.  Neurological: She is alert and oriented to person, place, and time.  Skin: Skin is warm, dry and intact. No rash noted. No pallor.  Psychiatric: She has a normal mood and affect. Her behavior is normal. Judgment and thought content normal.    Results for orders placed or performed in visit on 12/31/15  TSH  Result Value Ref Range   TSH 2.370 0.450 - 4.500 uIU/mL      Assessment & Plan:   Problem List Items Addressed This Visit      Unprioritized   Hypertension    Stable, continue present medications.        IFG (impaired fasting glucose) - Primary    Hgb A1C is 6.3.  Continue present lifestyle changes      Relevant Orders   Bayer DCA Hb A1c Waived       Follow up plan: Return in about 6 months (around 05/03/2017).

## 2016-11-03 NOTE — Assessment & Plan Note (Signed)
Hgb A1C is 6.3.  Continue present lifestyle changes 

## 2017-04-05 ENCOUNTER — Other Ambulatory Visit: Payer: Self-pay | Admitting: Unknown Physician Specialty

## 2017-05-03 ENCOUNTER — Ambulatory Visit: Payer: Managed Care, Other (non HMO) | Admitting: Unknown Physician Specialty

## 2017-05-17 ENCOUNTER — Ambulatory Visit: Payer: Self-pay | Admitting: Unknown Physician Specialty

## 2017-05-25 ENCOUNTER — Ambulatory Visit (INDEPENDENT_AMBULATORY_CARE_PROVIDER_SITE_OTHER): Payer: Managed Care, Other (non HMO) | Admitting: Family Medicine

## 2017-05-25 ENCOUNTER — Encounter: Payer: Self-pay | Admitting: Family Medicine

## 2017-05-25 VITALS — BP 152/80 | HR 57 | Temp 98.4°F | Wt 193.4 lb

## 2017-05-25 DIAGNOSIS — E7800 Pure hypercholesterolemia, unspecified: Secondary | ICD-10-CM | POA: Diagnosis not present

## 2017-05-25 DIAGNOSIS — E039 Hypothyroidism, unspecified: Secondary | ICD-10-CM | POA: Diagnosis not present

## 2017-05-25 DIAGNOSIS — R7301 Impaired fasting glucose: Secondary | ICD-10-CM

## 2017-05-25 DIAGNOSIS — Z780 Asymptomatic menopausal state: Secondary | ICD-10-CM

## 2017-05-25 DIAGNOSIS — I129 Hypertensive chronic kidney disease with stage 1 through stage 4 chronic kidney disease, or unspecified chronic kidney disease: Secondary | ICD-10-CM

## 2017-05-25 LAB — MICROALBUMIN, URINE WAIVED
CREATININE, URINE WAIVED: 200 mg/dL (ref 10–300)
Microalb, Ur Waived: 30 mg/L — ABNORMAL HIGH (ref 0–19)
Microalb/Creat Ratio: <30 mg/g (ref <30)

## 2017-05-25 LAB — UA/M W/RFLX CULTURE, ROUTINE
BILIRUBIN UA: NEGATIVE
Glucose, UA: NEGATIVE
Leukocytes, UA: NEGATIVE
NITRITE UA: NEGATIVE
PH UA: 6 (ref 5.0–7.5)
PROTEIN UA: NEGATIVE
RBC UA: NEGATIVE
Specific Gravity, UA: 1.02 (ref 1.005–1.030)
UUROB: 0.2 mg/dL (ref 0.2–1.0)

## 2017-05-25 LAB — BAYER DCA HB A1C WAIVED: HB A1C (BAYER DCA - WAIVED): 5.6 % (ref <7.0)

## 2017-05-25 LAB — MICROSCOPIC EXAMINATION: BACTERIA UA: NONE SEEN

## 2017-05-25 MED ORDER — ATORVASTATIN CALCIUM 10 MG PO TABS: 10.0000 mg | ORAL_TABLET | Freq: Every day | ORAL | 1 refills | Status: DC

## 2017-05-25 MED ORDER — METOPROLOL SUCCINATE ER 100 MG PO TB24: 100.0000 mg | ORAL_TABLET | Freq: Every day | ORAL | 3 refills | Status: DC

## 2017-05-25 MED ORDER — AMLODIPINE BESYLATE 5 MG PO TABS: 5.0000 mg | ORAL_TABLET | Freq: Every day | ORAL | 3 refills | Status: DC

## 2017-05-25 NOTE — Assessment & Plan Note (Signed)
Stable on current regimen. Continue current regimen. Continue to monitor. Call with any concerns.  

## 2017-05-25 NOTE — Progress Notes (Signed)
BP (!) 152/80   Pulse (!) 57   Temp 98.4 F (36.9 C)   Wt 193 lb 6.4 oz (87.7 kg)   LMP  (LMP Unknown)   SpO2 100%   BMI 33.94 kg/m    Subjective:    Patient ID: Sherry Patel, female    DOB: Jun 21, 1966, 51 y.o.   MRN: [id number]  HPI: Sherry Patel is a 51 y.o. female  Chief Complaint  Patient presents with  . Hypertension  . Hyperlipidemia  . Hypothyroidism   MENOPAUSAL SYMPTOMS Gravida/Para: G5P5 Duration: last year or so Symptom severity: moderate Hot flashes: yes Night sweats: yes Sleep disturbances: yes Vaginal dryness: no Dyspareunia:no Decreased libido: no Emotional lability: yes Stress incontinence: no Previous HRT/pharmacotherapy: no Hysterectomy: no Absolute Contraindications to Hormonal Therapy:     Undiagnosed vaginal bleeding: no    Breast cancer: no    Endometrial cancer: no    Coronary disease: yes    Cerebrovascular disease: yes    Venous thromboembolic disease: no  Impaired Fasting Glucose HbA1C:  Duration of elevated blood sugar: chronic Polydipsia: no Polyuria: no Weight change: yes Visual disturbance: no Glucose Monitoring: no Diabetic Education: Not Completed Family history of diabetes: yes  HYPERTENSION / HYPERLIPIDEMIA Satisfied with current treatment? yes Duration of hypertension: chronic BP monitoring frequency: not checking BP range:  BP medication side effects: no Past BP meds: amlodipine, metoprolol Duration of hyperlipidemia: chronic Cholesterol medication side effects: no Cholesterol supplements: none Past cholesterol medications: atorvastin Medication compliance: excellent compliance Aspirin: no Recent stressors: yes Recurrent headaches: no Visual changes: no Palpitations: no Dyspnea: no Chest pain: no Lower extremity edema: no Dizzy/lightheaded: yes  HYPOTHYROIDISM- stopped taking her medicine.  Thyroid control status:stable Satisfied with current treatment? yes Medication side effects: no Medication  compliance: excellent compliance Recent dose adjustment:no Fatigue: yes Cold intolerance: yes Heat intolerance: no Weight gain: yes Weight loss: no Constipation: no Diarrhea/loose stools: no Palpitations: no Lower extremity edema: no Anxiety/depressed mood: yes  Relevant past medical, surgical, family and social history reviewed and updated as indicated. Interim medical history since our last visit reviewed. Allergies and medications reviewed and updated.  Review of Systems  Constitutional: Positive for diaphoresis and fatigue. Negative for activity change, appetite change, chills, fever and unexpected weight change.  Respiratory: Negative.   Cardiovascular: Negative.   Genitourinary: Negative.   Neurological: Negative.   Psychiatric/Behavioral: Negative.     Per HPI unless specifically indicated above     Objective:    BP (!) 152/80   Pulse (!) 57   Temp 98.4 F (36.9 C)   Wt 193 lb 6.4 oz (87.7 kg)   LMP  (LMP Unknown)   SpO2 100%   BMI 33.94 kg/m   Wt Readings from Last 3 Encounters:  05/25/17 193 lb 6.4 oz (87.7 kg)  11/03/16 197 lb 3.2 oz (89.4 kg)  10/02/16 198 lb (89.8 kg)    Physical Exam  Constitutional: She is oriented to person, place, and time. She appears well-developed and well-nourished. No distress.  HENT:  Head: Normocephalic and atraumatic.  Right Ear: Hearing normal.  Left Ear: Hearing normal.  Nose: Nose normal.  Eyes: Conjunctivae and lids are normal. Right eye exhibits no discharge. Left eye exhibits no discharge. No scleral icterus.  Cardiovascular: Normal rate, regular rhythm, normal heart sounds and intact distal pulses.  Exam reveals no gallop and no friction rub.   No murmur heard. Pulmonary/Chest: Effort normal and breath sounds normal. No respiratory distress. She has no wheezes.  She has no rales. She exhibits no tenderness.  Musculoskeletal: Normal range of motion.  Neurological: She is alert and oriented to person, place, and  time.  Skin: Skin is warm, dry and intact. No rash noted. She is not diaphoretic. No erythema. No pallor.  Psychiatric: She has a normal mood and affect. Her speech is normal and behavior is normal. Judgment and thought content normal. Cognition and memory are normal.  Nursing note and vitals reviewed.   Results for orders placed or performed in visit on 11/03/16  Bayer DCA Hb A1c Waived  Result Value Ref Range   Bayer DCA Hb A1c Waived 6.3 <7.0 %      Assessment & Plan:   Problem List Items Addressed This Visit      Endocrine   IFG (impaired fasting glucose) - Primary    Under good control with A1c of 5.6. Continue diet and exercise. Continue to monitor. Call with any concerns.       Relevant Orders   CBC with Differential/Platelet   Bayer DCA Hb A1c Waived   Microalbumin, Urine Waived   UA/M w/rflx Culture, Routine   Comprehensive metabolic panel   Thyroid activity decreased    Stopped her medicine. Will recheck levels today. Await results.       Relevant Medications   metoprolol succinate (TOPROL-XL) 100 MG 24 hr tablet   Other Relevant Orders   CBC with Differential/Platelet   TSH   UA/M w/rflx Culture, Routine   Comprehensive metabolic panel     Genitourinary   Hypertensive CKD (chronic kidney disease)    Not under good control. Previous ?allergy to lisinopril. Will increase amlodipine to 5mg - call with any itching. Recheck BP in 1 month.       Relevant Orders   CBC with Differential/Platelet   Microalbumin, Urine Waived   UA/M w/rflx Culture, Routine   Comprehensive metabolic panel     Other   Hyperlipidemia    Stable on current regimen. Continue current regimen. Continue to monitor. Call with any concerns.       Relevant Medications   amLODipine (NORVASC) 5 MG tablet   metoprolol succinate (TOPROL-XL) 100 MG 24 hr tablet   atorvastatin (LIPITOR) 10 MG tablet   Other Relevant Orders   CBC with Differential/Platelet   Lipid Panel w/o Chol/HDL Ratio    UA/M w/rflx Culture, Routine   Comprehensive metabolic panel   Menopause    Having a lot of issues. Will start black cohash. Recheck 1 month.           Follow up plan: Return in about 4 weeks (around 06/22/2017) for Follow up BP and menopause.

## 2017-05-25 NOTE — Assessment & Plan Note (Signed)
Under good control with A1c of 5.6. Continue diet and exercise. Continue to monitor. Call with any concerns.

## 2017-05-25 NOTE — Assessment & Plan Note (Signed)
Not under good control. Previous ?allergy to lisinopril. Will increase amlodipine to 5mg - call with any itching. Recheck BP in 1 month.

## 2017-05-25 NOTE — Assessment & Plan Note (Signed)
Having a lot of issues. Will start black cohash. Recheck 1 month.

## 2017-05-25 NOTE — Assessment & Plan Note (Signed)
Stopped her medicine. Will recheck levels today. Await results.

## 2017-05-25 NOTE — Patient Instructions (Addendum)
Menopause and Herbal Products What is menopause? Menopause is the normal time of life when menstrual periods decrease in frequency and eventually stop completely. This process can take several years for some women. Menopause is complete when you have had an absence of menstruation for a full year since your last menstrual period. It usually occurs between the ages of 48 and 55. It is not common for menopause to begin before the age of 40. During menopause, your body stops producing the female hormones estrogen and progesterone. Common symptoms associated with this loss of hormones (vasomotor symptoms) are:  Hot flashes.  Hot flushes.  Night sweats. Other common symptoms and complications of menopause include:  Decrease in sex drive.  Vaginal dryness and thinning of the walls of the vagina. This can make sex painful.  Dryness of the skin and development of wrinkles.  Headaches.  Tiredness.  Irritability.  Memory problems.  Weight gain.  Bladder infections.  Hair growth on the face and chest.  Inability to reproduce offspring (infertility).  Loss of density in the bones (osteoporosis) increasing your risk for breaks (fractures).  Depression.  Hardening and narrowing of the arteries (atherosclerosis). This increases your risk of heart attack and stroke. What treatment options are available? There are many treatment choices for menopause symptoms. The most common treatment is hormone replacement therapy. Many alternative therapies for menopause are emerging, including the use of herbal products. These supplements can be found in the form of herbs, teas, oils, tinctures, and pills. Common herbal supplements for menopause are made from plants that contain phytoestrogens. Phytoestrogens are compounds that occur naturally in plants and plant products. They act like estrogen in the body. Foods and herbs that contain phytoestrogens include:  Soy.  Flax seeds.  Red  clover.  Ginseng. What menopause symptoms may be helped if I use herbal products?  Vasomotor symptoms. These may be helped by:  Soy. Some studies show that soy may have a moderate benefit for hot flashes.  Black cohosh. There is limited evidence indicating this may be beneficial for hot flashes.  Evening Primrose Oil  Symptoms that are related to heart and blood vessel disease. These may be helped by soy. Studies have shown that soy can help to lower cholesterol.  Depression. This may be helped by:  St. John's wort. There is limited evidence that shows this may help mild to moderate depression.  Black cohosh. There is evidence that this may help depression and mood swings.  Osteoporosis. Soy may help to decrease bone loss that is associated with menopause and may prevent osteoporosis. Limited evidence indicates that red clover may offer some bone loss protection as well. Other herbal products that are commonly used during menopause lack enough evidence to support their use as a replacement for conventional menopause therapies. These products include evening primrose, ginseng, and red clover. What are the cases when herbal products should not be used during menopause? Do not use herbal products during menopause without your health care provider's approval if:  You are taking medicine.  You have a preexisting liver condition. Are there any risks in my taking herbal products during menopause? If you choose to use herbal products to help with symptoms of menopause, keep in mind that:  Different supplements have different and unmeasured amounts of herbal ingredients.  Herbal products are not regulated the same way that medicines are.  Concentrations of herbs may vary depending on the way they are prepared. For example, the concentration may be different in a pill,   tea, oil, and tincture.  Little is known about the risks of using herbal products, particularly the risks of long-term  use.  Some herbal supplements can be harmful when combined with certain medicines. Most commonly reported side effects of herbal products are mild. However, if used improperly, many herbal supplements can cause serious problems. Talk to your health care provider before starting any herbal product. If problems develop, stop taking the supplement and let your health care provider know. This information is not intended to replace advice given to you by your health care provider. Make sure you discuss any questions you have with your health care provider. Document Released: 05/18/2008 Document Revised: 10/27/2016 Document Reviewed: 05/15/2014 Elsevier Interactive Patient Education  2017 [employer]  

## 2017-05-26 ENCOUNTER — Encounter: Payer: Self-pay | Admitting: Family Medicine

## 2017-05-26 LAB — LIPID PANEL W/O CHOL/HDL RATIO
CHOLESTEROL TOTAL: 191 mg/dL (ref 100–199)
HDL: 46 mg/dL (ref >39)
LDL Calculated: 128 mg/dL — ABNORMAL HIGH (ref 0–99)
TRIGLYCERIDES: 83 mg/dL (ref 0–149)
VLDL Cholesterol Cal: 17 mg/dL (ref 5–40)

## 2017-05-26 LAB — CBC WITH DIFFERENTIAL/PLATELET
BASOS ABS: 0 10*3/uL (ref 0.0–0.2)
Basos: 0 %
EOS (ABSOLUTE): 0.1 10*3/uL (ref 0.0–0.4)
Eos: 2 %
Hematocrit: 40.1 % (ref 34.0–46.6)
Hemoglobin: 13.1 g/dL (ref 11.1–15.9)
IMMATURE GRANS (ABS): 0 10*3/uL (ref 0.0–0.1)
Immature Granulocytes: 0 %
LYMPHS ABS: 2.9 10*3/uL (ref 0.7–3.1)
LYMPHS: 34 %
MCH: 27.8 pg (ref 26.6–33.0)
MCHC: 32.7 g/dL (ref 31.5–35.7)
MCV: 85 fL (ref 79–97)
MONOS ABS: 0.5 10*3/uL (ref 0.1–0.9)
Monocytes: 6 %
NEUTROS ABS: 4.9 10*3/uL (ref 1.4–7.0)
Neutrophils: 58 %
PLATELETS: 355 10*3/uL (ref 150–379)
RBC: 4.71 x10E6/uL (ref 3.77–5.28)
RDW: 15.9 % — AB (ref 12.3–15.4)
WBC: 8.5 10*3/uL (ref 3.4–10.8)

## 2017-05-26 LAB — COMPREHENSIVE METABOLIC PANEL
A/G RATIO: 1.3 (ref 1.2–2.2)
ALBUMIN: 4.3 g/dL (ref 3.5–5.5)
ALT: 11 IU/L (ref 0–32)
AST: 13 IU/L (ref 0–40)
Alkaline Phosphatase: 80 IU/L (ref 39–117)
BILIRUBIN TOTAL: 0.3 mg/dL (ref 0.0–1.2)
BUN / CREAT RATIO: 12 (ref 9–23)
BUN: 8 mg/dL (ref 6–24)
CHLORIDE: 101 mmol/L (ref 96–106)
CO2: 25 mmol/L (ref 20–29)
Calcium: 9.6 mg/dL (ref 8.7–10.2)
Creatinine, Ser: 0.65 mg/dL (ref 0.57–1.00)
GFR calc non Af Amer: 102 mL/min/{1.73_m2} (ref >59)
GFR, EST AFRICAN AMERICAN: 117 mL/min/{1.73_m2} (ref >59)
GLOBULIN, TOTAL: 3.2 g/dL (ref 1.5–4.5)
Glucose: 79 mg/dL (ref 65–99)
POTASSIUM: 4.6 mmol/L (ref 3.5–5.2)
SODIUM: 141 mmol/L (ref 134–144)
TOTAL PROTEIN: 7.5 g/dL (ref 6.0–8.5)

## 2017-05-26 LAB — TSH: TSH: 3.01 u[IU]/mL (ref 0.450–4.500)

## 2017-06-01 ENCOUNTER — Telehealth: Payer: Self-pay | Admitting: Family Medicine

## 2017-06-01 NOTE — Telephone Encounter (Signed)
Pt would like to know if she could stop taking atorvastatin (LIPITOR) 10 MG tablet.

## 2017-06-02 NOTE — Telephone Encounter (Signed)
It is keeping her cholesterol controlled. If she would like, she can come off it for 3 months and we can recheck, but if her cholesterol goes back up, I'm going to recommend that she restart it.

## 2017-06-02 NOTE — Telephone Encounter (Signed)
Patient notified

## 2017-06-30 ENCOUNTER — Ambulatory Visit: Payer: Managed Care, Other (non HMO) | Admitting: Family Medicine

## 2017-07-23 ENCOUNTER — Other Ambulatory Visit: Payer: Self-pay | Admitting: Unknown Physician Specialty

## 2017-07-23 MED ORDER — AMLODIPINE BESYLATE 5 MG PO TABS: 5.0000 mg | ORAL_TABLET | Freq: Every day | ORAL | 3 refills | Status: DC

## 2017-07-23 NOTE — Telephone Encounter (Signed)
Routing to provider. Patient requests prescription be sent to [facility], last visit was 05/25/17.

## 2017-07-23 NOTE — Telephone Encounter (Signed)
Pt called and stated she would like to have a refill for amlodipine sent to [facility].

## 2017-07-23 NOTE — Telephone Encounter (Signed)
Called and let patient know that [facility].

## 2017-10-01 ENCOUNTER — Other Ambulatory Visit: Payer: Self-pay | Admitting: Unknown Physician Specialty

## 2017-10-01 NOTE — Telephone Encounter (Signed)
Please get patient scheduled with Elnita Maxwellheryl.  Let Dr.Johnson know when patient is scheduled.

## 2017-10-01 NOTE — Telephone Encounter (Signed)
Needs an appointment. Will get her enough medicine to make it to appointment when it's booked.   

## 2017-10-01 NOTE — Telephone Encounter (Signed)
Called patient to schedule fu appt. Left message on VM for patient to call to schedule a fu appointment

## 2017-10-13 ENCOUNTER — Other Ambulatory Visit: Payer: Self-pay | Admitting: Unknown Physician Specialty

## 2017-10-13 DIAGNOSIS — Z1231 Encounter for screening mammogram for malignant neoplasm of breast: Secondary | ICD-10-CM

## 2017-10-18 ENCOUNTER — Ambulatory Visit: Payer: Managed Care, Other (non HMO) | Admitting: Family Medicine

## 2017-10-19 ENCOUNTER — Ambulatory Visit (INDEPENDENT_AMBULATORY_CARE_PROVIDER_SITE_OTHER): Payer: Managed Care, Other (non HMO) | Admitting: Family Medicine

## 2017-10-19 ENCOUNTER — Encounter: Payer: Self-pay | Admitting: Family Medicine

## 2017-10-19 VITALS — BP 128/72 | HR 69 | Wt 194.0 lb

## 2017-10-19 DIAGNOSIS — R7301 Impaired fasting glucose: Secondary | ICD-10-CM

## 2017-10-19 DIAGNOSIS — I129 Hypertensive chronic kidney disease with stage 1 through stage 4 chronic kidney disease, or unspecified chronic kidney disease: Secondary | ICD-10-CM | POA: Diagnosis not present

## 2017-10-19 DIAGNOSIS — E7800 Pure hypercholesterolemia, unspecified: Secondary | ICD-10-CM | POA: Diagnosis not present

## 2017-10-19 DIAGNOSIS — E039 Hypothyroidism, unspecified: Secondary | ICD-10-CM | POA: Diagnosis not present

## 2017-10-19 DIAGNOSIS — Z780 Asymptomatic menopausal state: Secondary | ICD-10-CM

## 2017-10-19 LAB — BAYER DCA HB A1C WAIVED: HB A1C (BAYER DCA - WAIVED): 6 % (ref <7.0)

## 2017-10-19 MED ORDER — METOPROLOL SUCCINATE ER 100 MG PO TB24: 100.0000 mg | ORAL_TABLET | Freq: Every day | ORAL | 1 refills | Status: DC

## 2017-10-19 MED ORDER — ATORVASTATIN CALCIUM 10 MG PO TABS: 10.0000 mg | ORAL_TABLET | Freq: Every day | ORAL | 1 refills | Status: DC

## 2017-10-19 MED ORDER — AMLODIPINE BESYLATE 5 MG PO TABS: 5.0000 mg | ORAL_TABLET | Freq: Every day | ORAL | 3 refills | Status: DC

## 2017-10-19 NOTE — Assessment & Plan Note (Signed)
Rechecking levels today. Await results. Call with any concerns.  

## 2017-10-19 NOTE — Progress Notes (Signed)
BP 128/72 (BP Location: Left Arm, Cuff Size: Normal)   Pulse 69   Wt 194 lb (88 kg)   LMP  (LMP Unknown)   SpO2 98%   BMI 34.04 kg/m    Subjective:    Patient ID: Sherry Patel, female    DOB: Oct 26, 1966, 51 y.o.   MRN: [id number]  HPI: Sherry Patel is a 51 y.o. female  Chief Complaint  Patient presents with  . Follow-up   HYPOTHYROIDISM- not taking her thyroid medicine. Has been about a year since she's taken it.  Thyroid control status:stable Satisfied with current treatment? yes Medication side effects: Not on anything Fatigue: no Cold intolerance: no Heat intolerance: no Weight gain: no Weight loss: no Constipation: no Diarrhea/loose stools: no Palpitations: no Lower extremity edema: no Anxiety/depressed mood: no  HYPERTENSION / HYPERLIPIDEMIA Satisfied with current treatment? yes Duration of hypertension: chronic BP monitoring frequency: not checking BP range: 130s BP medication side effects: no Past BP meds: metoprolol, amlodipine Duration of hyperlipidemia: chronic Cholesterol medication side effects: no Cholesterol supplements: none Past cholesterol medications: atorvastatin Medication compliance: good compliance Aspirin: no Recent stressors: no Recurrent headaches: no Visual changes: no Palpitations: no Dyspnea: no Chest pain: no Lower extremity edema: no Dizzy/lightheaded: no  Impaired Fasting Glucose HbA1C: 6.0 Duration of elevated blood sugar: chronic Polydipsia: no Polyuria: no Weight change: no Visual disturbance: no Glucose Monitoring: no  Relevant past medical, surgical, family and social history reviewed and updated as indicated. Interim medical history since our last visit reviewed. Allergies and medications reviewed and updated.  Review of Systems  Constitutional: Negative.   Respiratory: Negative.   Cardiovascular: Negative.   Neurological: Negative.   Psychiatric/Behavioral: Negative.     Per HPI unless specifically  indicated above     Objective:    BP 128/72 (BP Location: Left Arm, Cuff Size: Normal)   Pulse 69   Wt 194 lb (88 kg)   LMP  (LMP Unknown)   SpO2 98%   BMI 34.04 kg/m   Wt Readings from Last 3 Encounters:  10/19/17 194 lb (88 kg)  05/25/17 193 lb 6.4 oz (87.7 kg)  11/03/16 197 lb 3.2 oz (89.4 kg)    Physical Exam  Constitutional: She is oriented to person, place, and time. She appears well-developed and well-nourished. No distress.  HENT:  Head: Normocephalic and atraumatic.  Right Ear: Hearing normal.  Left Ear: Hearing normal.  Nose: Nose normal.  Eyes: Conjunctivae and lids are normal. Right eye exhibits no discharge. Left eye exhibits no discharge. No scleral icterus.  Pulmonary/Chest: Effort normal. No respiratory distress.  Musculoskeletal: Normal range of motion.  Neurological: She is alert and oriented to person, place, and time.  Skin: Skin is intact. No rash noted.  Psychiatric: She has a normal mood and affect. Her speech is normal and behavior is normal. Judgment and thought content normal. Cognition and memory are normal.    Results for orders placed or performed in visit on 05/25/17  Microscopic Examination  Result Value Ref Range   WBC, UA 0-5 0 - 5 /hpf   RBC, UA 0-2 0 - 2 /hpf   Epithelial Cells (non renal) 0-10 0 - 10 /hpf   Bacteria, UA None seen None seen/Few  CBC with Differential/Platelet  Result Value Ref Range   WBC 8.5 3.4 - 10.8 x10E3/uL   RBC 4.71 3.77 - 5.28 x10E6/uL   Hemoglobin 13.1 11.1 - 15.9 g/dL   Hematocrit 56.240.1 13.034.0 - 46.6 %   MCV  85 79 - 97 fL   MCH 27.8 26.6 - 33.0 pg   MCHC 32.7 31.5 - 35.7 g/dL   RDW 44.0 (H) 34.7 - 42.5 %   Platelets 355 150 - 379 x10E3/uL   Neutrophils 58 Not Estab. %   Lymphs 34 Not Estab. %   Monocytes 6 Not Estab. %   Eos 2 Not Estab. %   Basos 0 Not Estab. %   Neutrophils Absolute 4.9 1.4 - 7.0 x10E3/uL   Lymphocytes Absolute 2.9 0.7 - 3.1 x10E3/uL   Monocytes Absolute 0.5 0.1 - 0.9 x10E3/uL   EOS  (ABSOLUTE) 0.1 0.0 - 0.4 x10E3/uL   Basophils Absolute 0.0 0.0 - 0.2 x10E3/uL   Immature Granulocytes 0 Not Estab. %   Immature Grans (Abs) 0.0 0.0 - 0.1 x10E3/uL  Bayer DCA Hb A1c Waived  Result Value Ref Range   Bayer DCA Hb A1c Waived 5.6 <7.0 %  Lipid Panel w/o Chol/HDL Ratio  Result Value Ref Range   Cholesterol, Total 191 100 - 199 mg/dL   Triglycerides 83 0 - 149 mg/dL   HDL 46 >95 mg/dL   VLDL Cholesterol Cal 17 5 - 40 mg/dL   LDL Calculated 638 (H) 0 - 99 mg/dL  Microalbumin, Urine Waived  Result Value Ref Range   Microalb, Ur Waived 30 (H) 0 - 19 mg/L   Creatinine, Urine Waived 200 10 - 300 mg/dL   Microalb/Creat Ratio <30 <30 mg/g  TSH  Result Value Ref Range   TSH 3.010 0.450 - 4.500 uIU/mL  UA/M w/rflx Culture, Routine  Result Value Ref Range   Specific Gravity, UA 1.020 1.005 - 1.030   pH, UA 6.0 5.0 - 7.5   Color, UA Yellow Yellow   Appearance Ur Clear Clear   Leukocytes, UA Negative Negative   Protein, UA Negative Negative/Trace   Glucose, UA Negative Negative   Ketones, UA 1+ (A) Negative   RBC, UA Negative Negative   Bilirubin, UA Negative Negative   Urobilinogen, Ur 0.2 0.2 - 1.0 mg/dL   Nitrite, UA Negative Negative   Microscopic Examination See below:   Comprehensive metabolic panel  Result Value Ref Range   Glucose 79 65 - 99 mg/dL   BUN 8 6 - 24 mg/dL   Creatinine, Ser 7.56 0.57 - 1.00 mg/dL   GFR calc non Af Amer 102 >59 mL/min/1.73   GFR calc Af Amer 117 >59 mL/min/1.73   BUN/Creatinine Ratio 12 9 - 23   Sodium 141 134 - 144 mmol/L   Potassium 4.6 3.5 - 5.2 mmol/L   Chloride 101 96 - 106 mmol/L   CO2 25 20 - 29 mmol/L   Calcium 9.6 8.7 - 10.2 mg/dL   Total Protein 7.5 6.0 - 8.5 g/dL   Albumin 4.3 3.5 - 5.5 g/dL   Globulin, Total 3.2 1.5 - 4.5 g/dL   Albumin/Globulin Ratio 1.3 1.2 - 2.2   Bilirubin Total 0.3 0.0 - 1.2 mg/dL   Alkaline Phosphatase 80 39 - 117 IU/L   AST 13 0 - 40 IU/L   ALT 11 0 - 32 IU/L      Assessment & Plan:    Problem List Items Addressed This Visit      Endocrine   IFG (impaired fasting glucose)    Under good control with A1c of 6.0. Recheck 6 months.       Relevant Orders   Bayer DCA Hb A1c Waived   CBC with Differential/Platelet   Comprehensive metabolic panel  Thyroid activity decreased - Primary    Rechecking levels today. Await results. Call with any concerns.       Relevant Medications   metoprolol succinate (TOPROL-XL) 100 MG 24 hr tablet   Other Relevant Orders   CBC with Differential/Platelet   Comprehensive metabolic panel   TSH     Genitourinary   Hypertensive CKD (chronic kidney disease)    Under good control on recheck. Continue current regimen. Continue to monitor. Call with any concerns.       Relevant Orders   CBC with Differential/Platelet   Comprehensive metabolic panel     Other   Hyperlipidemia    Restarted medicine when her levels were high at screening at work. Rechecking today. Continue current regimen. Continue to monitor.       Relevant Medications   amLODipine (NORVASC) 5 MG tablet   atorvastatin (LIPITOR) 10 MG tablet   metoprolol succinate (TOPROL-XL) 100 MG 24 hr tablet   Other Relevant Orders   CBC with Differential/Platelet   Comprehensive metabolic panel   Lipid Panel w/o Chol/HDL Ratio   Menopause    Improved. Information about herbal supplements given. Call with any concerns.       Relevant Orders   CBC with Differential/Platelet   Comprehensive metabolic panel       Follow up plan: Return in about 6 months (around 04/18/2018) for Physical.

## 2017-10-19 NOTE — Assessment & Plan Note (Signed)
Under good control with A1c of 6.0. Recheck 6 months.

## 2017-10-19 NOTE — Assessment & Plan Note (Signed)
Restarted medicine when her levels were high at screening at work. Rechecking today. Continue current regimen. Continue to monitor.

## 2017-10-19 NOTE — Patient Instructions (Addendum)
Menopause and Herbal Products What is menopause? Menopause is the normal time of life when menstrual periods decrease in frequency and eventually stop completely. This process can take several years for some women. Menopause is complete when you have had an absence of menstruation for a full year since your last menstrual period. It usually occurs between the ages of 48 and 55. It is not common for menopause to begin before the age of 40. During menopause, your body stops producing the female hormones estrogen and progesterone. Common symptoms associated with this loss of hormones (vasomotor symptoms) are:  Hot flashes.  Hot flushes.  Night sweats. Other common symptoms and complications of menopause include:  Decrease in sex drive.  Vaginal dryness and thinning of the walls of the vagina. This can make sex painful.  Dryness of the skin and development of wrinkles.  Headaches.  Tiredness.  Irritability.  Memory problems.  Weight gain.  Bladder infections.  Hair growth on the face and chest.  Inability to reproduce offspring (infertility).  Loss of density in the bones (osteoporosis) increasing your risk for breaks (fractures).  Depression.  Hardening and narrowing of the arteries (atherosclerosis). This increases your risk of heart attack and stroke. What treatment options are available? There are many treatment choices for menopause symptoms. The most common treatment is hormone replacement therapy. Many alternative therapies for menopause are emerging, including the use of herbal products. These supplements can be found in the form of herbs, teas, oils, tinctures, and pills. Common herbal supplements for menopause are made from plants that contain phytoestrogens. Phytoestrogens are compounds that occur naturally in plants and plant products. They act like estrogen in the body. Foods and herbs that contain phytoestrogens include:  Soy.  Flax seeds.  Red  clover.  Ginseng. What menopause symptoms may be helped if I use herbal products?  Vasomotor symptoms. These may be helped by:  Soy. Some studies show that soy may have a moderate benefit for hot flashes.  Black cohosh. There is limited evidence indicating this may be beneficial for hot flashes.  Evening Primrose Oil  Symptoms that are related to heart and blood vessel disease. These may be helped by soy. Studies have shown that soy can help to lower cholesterol.  Depression. This may be helped by:  St. John's wort. There is limited evidence that shows this may help mild to moderate depression.  Black cohosh. There is evidence that this may help depression and mood swings.  Osteoporosis. Soy may help to decrease bone loss that is associated with menopause and may prevent osteoporosis. Limited evidence indicates that red clover may offer some bone loss protection as well. Other herbal products that are commonly used during menopause lack enough evidence to support their use as a replacement for conventional menopause therapies. These products include evening primrose, ginseng, and red clover. What are the cases when herbal products should not be used during menopause? Do not use herbal products during menopause without your health care provider's approval if:  You are taking medicine.  You have a preexisting liver condition. Are there any risks in my taking herbal products during menopause? If you choose to use herbal products to help with symptoms of menopause, keep in mind that:  Different supplements have different and unmeasured amounts of herbal ingredients.  Herbal products are not regulated the same way that medicines are.  Concentrations of herbs may vary depending on the way they are prepared. For example, the concentration may be different in a pill,   tea, oil, and tincture.  Little is known about the risks of using herbal products, particularly the risks of long-term  use.  Some herbal supplements can be harmful when combined with certain medicines. Most commonly reported side effects of herbal products are mild. However, if used improperly, many herbal supplements can cause serious problems. Talk to your health care provider before starting any herbal product. If problems develop, stop taking the supplement and let your health care provider know. This information is not intended to replace advice given to you by your health care provider. Make sure you discuss any questions you have with your health care provider. Document Released: 05/18/2008 Document Revised: 10/27/2016 Document Reviewed: 05/15/2014 Elsevier Interactive Patient Education  2017 [employer]  

## 2017-10-19 NOTE — Assessment & Plan Note (Signed)
Under good control on recheck. Continue current regimen. Continue to monitor. Call with any concerns.  

## 2017-10-19 NOTE — Assessment & Plan Note (Signed)
Improved. Information about herbal supplements given. Call with any concerns.

## 2017-10-20 ENCOUNTER — Encounter: Payer: Self-pay | Admitting: Family Medicine

## 2017-10-20 LAB — CBC WITH DIFFERENTIAL/PLATELET
BASOS ABS: 0 10*3/uL (ref 0.0–0.2)
Basos: 0 %
EOS (ABSOLUTE): 0.1 10*3/uL (ref 0.0–0.4)
Eos: 2 %
Hematocrit: 38.2 % (ref 34.0–46.6)
Hemoglobin: 12.4 g/dL (ref 11.1–15.9)
Immature Grans (Abs): 0 10*3/uL (ref 0.0–0.1)
Immature Granulocytes: 0 %
LYMPHS ABS: 3 10*3/uL (ref 0.7–3.1)
Lymphs: 37 %
MCH: 28.9 pg (ref 26.6–33.0)
MCHC: 32.5 g/dL (ref 31.5–35.7)
MCV: 89 fL (ref 79–97)
MONOCYTES: 5 %
MONOS ABS: 0.4 10*3/uL (ref 0.1–0.9)
Neutrophils Absolute: 4.6 10*3/uL (ref 1.4–7.0)
Neutrophils: 56 %
Platelets: 347 10*3/uL (ref 150–379)
RBC: 4.29 x10E6/uL (ref 3.77–5.28)
RDW: 15.3 % (ref 12.3–15.4)
WBC: 8.2 10*3/uL (ref 3.4–10.8)

## 2017-10-20 LAB — COMPREHENSIVE METABOLIC PANEL
ALK PHOS: 73 IU/L (ref 39–117)
ALT: 9 IU/L (ref 0–32)
AST: 11 IU/L (ref 0–40)
Albumin/Globulin Ratio: 1.3 (ref 1.2–2.2)
Albumin: 4.3 g/dL (ref 3.5–5.5)
BUN/Creatinine Ratio: 19 (ref 9–23)
BUN: 14 mg/dL (ref 6–24)
Bilirubin Total: 0.2 mg/dL (ref 0.0–1.2)
CO2: 23 mmol/L (ref 20–29)
CREATININE: 0.73 mg/dL (ref 0.57–1.00)
Calcium: 9.4 mg/dL (ref 8.7–10.2)
Chloride: 101 mmol/L (ref 96–106)
GFR calc Af Amer: 109 mL/min/{1.73_m2} (ref >59)
GFR calc non Af Amer: 94 mL/min/{1.73_m2} (ref >59)
GLUCOSE: 94 mg/dL (ref 65–99)
Globulin, Total: 3.4 g/dL (ref 1.5–4.5)
Potassium: 4.5 mmol/L (ref 3.5–5.2)
SODIUM: 138 mmol/L (ref 134–144)
Total Protein: 7.7 g/dL (ref 6.0–8.5)

## 2017-10-20 LAB — LIPID PANEL W/O CHOL/HDL RATIO
CHOLESTEROL TOTAL: 194 mg/dL (ref 100–199)
HDL: 46 mg/dL (ref >39)
LDL CALC: 131 mg/dL — AB (ref 0–99)
TRIGLYCERIDES: 86 mg/dL (ref 0–149)
VLDL CHOLESTEROL CAL: 17 mg/dL (ref 5–40)

## 2017-10-20 LAB — TSH: TSH: 6.98 u[IU]/mL — ABNORMAL HIGH (ref 0.450–4.500)

## 2017-11-02 ENCOUNTER — Ambulatory Visit
Admission: RE | Admit: 2017-11-02 | Discharge: 2017-11-02 | Disposition: A | Discharge status: Home / Self Care | Payer: Managed Care, Other (non HMO) | Source: Ambulatory Visit | Attending: Unknown Physician Specialty | Admitting: Unknown Physician Specialty

## 2017-11-02 DIAGNOSIS — Z1231 Encounter for screening mammogram for malignant neoplasm of breast: Secondary | ICD-10-CM | POA: Diagnosis present

## 2017-11-12 ENCOUNTER — Telehealth: Payer: Self-pay | Admitting: Unknown Physician Specialty

## 2017-11-12 NOTE — Telephone Encounter (Signed)
Copied from CRM [phone]. Topic: Quick Communication - See Telephone Encounter >> Nov 12, 2017 12:34 PM Terisa Starraylor, Brittany L wrote: CRM for notification. See Telephone encounter for:  Pt is requesting to start taking her SYNTHROID again. Pharmacy is [facility] [address].  11/12/17.

## 2017-11-12 NOTE — Telephone Encounter (Signed)
See request °

## 2017-11-12 NOTE — Telephone Encounter (Signed)
Routing to provider that patient has been seeing. Patient has not seen Elnita MaxwellCheryl in over a year and has a f/up with Dr. Laural BenesJohnson in May 2019.

## 2017-11-15 NOTE — Telephone Encounter (Signed)
Patient notified

## 2017-11-15 NOTE — Telephone Encounter (Signed)
Please have her come in for labwork at least

## 2018-04-20 ENCOUNTER — Ambulatory Visit (INDEPENDENT_AMBULATORY_CARE_PROVIDER_SITE_OTHER): Payer: Managed Care, Other (non HMO) | Admitting: Family Medicine

## 2018-04-20 ENCOUNTER — Encounter: Payer: Self-pay | Admitting: Family Medicine

## 2018-04-20 VITALS — BP 138/92 | HR 62 | Temp 98.8°F | Ht 62.4 in | Wt 196.2 lb

## 2018-04-20 DIAGNOSIS — E7800 Pure hypercholesterolemia, unspecified: Secondary | ICD-10-CM

## 2018-04-20 DIAGNOSIS — B354 Tinea corporis: Secondary | ICD-10-CM

## 2018-04-20 DIAGNOSIS — Z1239 Encounter for other screening for malignant neoplasm of breast: Secondary | ICD-10-CM

## 2018-04-20 DIAGNOSIS — I129 Hypertensive chronic kidney disease with stage 1 through stage 4 chronic kidney disease, or unspecified chronic kidney disease: Secondary | ICD-10-CM

## 2018-04-20 DIAGNOSIS — R7301 Impaired fasting glucose: Secondary | ICD-10-CM

## 2018-04-20 DIAGNOSIS — E039 Hypothyroidism, unspecified: Secondary | ICD-10-CM | POA: Diagnosis not present

## 2018-04-20 DIAGNOSIS — Z0000 Encounter for general adult medical examination without abnormal findings: Secondary | ICD-10-CM

## 2018-04-20 DIAGNOSIS — Z0001 Encounter for general adult medical examination with abnormal findings: Secondary | ICD-10-CM | POA: Diagnosis not present

## 2018-04-20 LAB — UA/M W/RFLX CULTURE, ROUTINE
Bilirubin, UA: NEGATIVE
Glucose, UA: NEGATIVE
Ketones, UA: NEGATIVE
Leukocytes, UA: NEGATIVE
Nitrite, UA: NEGATIVE
PH UA: 6 (ref 5.0–7.5)
Protein, UA: NEGATIVE
RBC, UA: NEGATIVE
Specific Gravity, UA: 1.02 (ref 1.005–1.030)
UUROB: 0.2 mg/dL (ref 0.2–1.0)

## 2018-04-20 LAB — MICROALBUMIN, URINE WAIVED
Creatinine, Urine Waived: 200 mg/dL (ref 10–300)
Microalb, Ur Waived: 30 mg/L — ABNORMAL HIGH (ref 0–19)
Microalb/Creat Ratio: <30 mg/g (ref <30)

## 2018-04-20 LAB — BAYER DCA HB A1C WAIVED: HB A1C (BAYER DCA - WAIVED): 5.8 % (ref <7.0)

## 2018-04-20 MED ORDER — METOPROLOL SUCCINATE ER 100 MG PO TB24: 100.0000 mg | ORAL_TABLET | Freq: Every day | ORAL | 1 refills | Status: DC

## 2018-04-20 MED ORDER — ATORVASTATIN CALCIUM 10 MG PO TABS: 10.0000 mg | ORAL_TABLET | Freq: Every day | ORAL | 1 refills | Status: DC

## 2018-04-20 MED ORDER — CLOTRIMAZOLE-BETAMETHASONE 1-0.05 % EX CREA: 1.0000 "application " | TOPICAL_CREAM | Freq: Two times a day (BID) | CUTANEOUS | 0 refills | Status: DC

## 2018-04-20 MED ORDER — AMLODIPINE BESYLATE 5 MG PO TABS: 5.0000 mg | ORAL_TABLET | Freq: Every day | ORAL | 3 refills | Status: DC

## 2018-04-20 NOTE — Patient Instructions (Signed)
Health Maintenance, Female Adopting a healthy lifestyle and getting preventive care can go a long way to promote health and wellness. Talk with your health care provider about what schedule of regular examinations is right for you. This is a good chance for you to check in with your provider about disease prevention and staying healthy. In between checkups, there are plenty of things you can do on your own. Experts have done a lot of research about which lifestyle changes and preventive measures are most likely to keep you healthy. Ask your health care provider for more information. Weight and diet Eat a healthy diet  Be sure to include plenty of vegetables, fruits, low-fat dairy products, and lean protein.  Do not eat a lot of foods high in solid fats, added sugars, or salt.  Get regular exercise. This is one of the most important things you can do for your health. ? Most adults should exercise for at least 150 minutes each week. The exercise should increase your heart rate and make you sweat (moderate-intensity exercise). ? Most adults should also do strengthening exercises at least twice a week. This is in addition to the moderate-intensity exercise.  Maintain a healthy weight  Body mass index (BMI) is a measurement that can be used to identify possible weight problems. It estimates body fat based on height and weight. Your health care provider can help determine your BMI and help you achieve or maintain a healthy weight.  For females 20 years of age and older: ? A BMI below 18.5 is considered underweight. ? A BMI of 18.5 to 24.9 is normal. ? A BMI of 25 to 29.9 is considered overweight. ? A BMI of 30 and above is considered obese.  Watch levels of cholesterol and blood lipids  You should start having your blood tested for lipids and cholesterol at 54 years of age, then have this test every 5 years.  You may need to have your cholesterol levels checked more often if: ? Your lipid or  cholesterol levels are high. ? You are older than 54 years of age. ? You are at high risk for heart disease.  Cancer screening Lung Cancer  Lung cancer screening is recommended for adults 55-80 years old who are at high risk for lung cancer because of a history of smoking.  A yearly low-dose CT scan of the lungs is recommended for people who: ? Currently smoke. ? Have quit within the past 15 years. ? Have at least a 30-pack-year history of smoking. A pack year is smoking an average of one pack of cigarettes a day for 1 year.  Yearly screening should continue until it has been 15 years since you quit.  Yearly screening should stop if you develop a health problem that would prevent you from having lung cancer treatment.  Breast Cancer  Practice breast self-awareness. This means understanding how your breasts normally appear and feel.  It also means doing regular breast self-exams. Let your health care provider know about any changes, no matter how small.  If you are in your 20s or 30s, you should have a clinical breast exam (CBE) by a health care provider every 1-3 years as part of a regular health exam.  If you are 40 or older, have a CBE every year. Also consider having a breast X-ray (mammogram) every year.  If you have a family history of breast cancer, talk to your health care provider about genetic screening.  If you are at high risk   for breast cancer, talk to your health care provider about having an MRI and a mammogram every year.  Breast cancer gene (BRCA) assessment is recommended for women who have family members with BRCA-related cancers. BRCA-related cancers include: ? Breast. ? Ovarian. ? Tubal. ? Peritoneal cancers.  Results of the assessment will determine the need for genetic counseling and BRCA1 and BRCA2 testing.  Cervical Cancer Your health care provider may recommend that you be screened regularly for cancer of the pelvic organs (ovaries, uterus, and  vagina). This screening involves a pelvic examination, including checking for microscopic changes to the surface of your cervix (Pap test). You may be encouraged to have this screening done every 3 years, beginning at age 22.  For women ages 56-65, health care providers may recommend pelvic exams and Pap testing every 3 years, or they may recommend the Pap and pelvic exam, combined with testing for human papilloma virus (HPV), every 5 years. Some types of HPV increase your risk of cervical cancer. Testing for HPV may also be done on women of any age with unclear Pap test results.  Other health care providers may not recommend any screening for nonpregnant women who are considered low risk for pelvic cancer and who do not have symptoms. Ask your health care provider if a screening pelvic exam is right for you.  If you have had past treatment for cervical cancer or a condition that could lead to cancer, you need Pap tests and screening for cancer for at least 20 years after your treatment. If Pap tests have been discontinued, your risk factors (such as having a new sexual partner) need to be reassessed to determine if screening should resume. Some women have medical problems that increase the chance of getting cervical cancer. In these cases, your health care provider may recommend more frequent screening and Pap tests.  Colorectal Cancer  This type of cancer can be detected and often prevented.  Routine colorectal cancer screening usually begins at 54 years of age and continues through 54 years of age.  Your health care provider may recommend screening at an earlier age if you have risk factors for colon cancer.  Your health care provider may also recommend using home test kits to check for hidden blood in the stool.  A small camera at the end of a tube can be used to examine your colon directly (sigmoidoscopy or colonoscopy). This is done to check for the earliest forms of colorectal  cancer.  Routine screening usually begins at age 33.  Direct examination of the colon should be repeated every 5-10 years through 54 years of age. However, you may need to be screened more often if early forms of precancerous polyps or small growths are found.  Skin Cancer  Check your skin from head to toe regularly.  Tell your health care provider about any new moles or changes in moles, especially if there is a change in a mole's shape or color.  Also tell your health care provider if you have a mole that is larger than the size of a pencil eraser.  Always use sunscreen. Apply sunscreen liberally and repeatedly throughout the day.  Protect yourself by wearing long sleeves, pants, a wide-brimmed hat, and sunglasses whenever you are outside.  Heart disease, diabetes, and high blood pressure  High blood pressure causes heart disease and increases the risk of stroke. High blood pressure is more likely to develop in: ? People who have blood pressure in the high end of  the normal range (130-139/85-89 mm Hg). ? People who are overweight or obese. ? People who are African American.  If you are 21-29 years of age, have your blood pressure checked every 3-5 years. If you are 3 years of age or older, have your blood pressure checked every year. You should have your blood pressure measured twice-once when you are at a hospital or clinic, and once when you are not at a hospital or clinic. Record the average of the two measurements. To check your blood pressure when you are not at a hospital or clinic, you can use: ? An automated blood pressure machine at a pharmacy. ? A home blood pressure monitor.  If you are between 17 years and 37 years old, ask your health care provider if you should take aspirin to prevent strokes.  Have regular diabetes screenings. This involves taking a blood sample to check your fasting blood sugar level. ? If you are at a normal weight and have a low risk for diabetes,  have this test once every three years after 54 years of age. ? If you are overweight and have a high risk for diabetes, consider being tested at a younger age or more often. Preventing infection Hepatitis B  If you have a higher risk for hepatitis B, you should be screened for this virus. You are considered at high risk for hepatitis B if: ? You were born in a country where hepatitis B is common. Ask your health care provider which countries are considered high risk. ? Your parents were born in a high-risk country, and you have not been immunized against hepatitis B (hepatitis B vaccine). ? You have HIV or AIDS. ? You use needles to inject street drugs. ? You live with someone who has hepatitis B. ? You have had sex with someone who has hepatitis B. ? You get hemodialysis treatment. ? You take certain medicines for conditions, including cancer, organ transplantation, and autoimmune conditions.  Hepatitis C  Blood testing is recommended for: ? Everyone born from [birth year] through 1967. ? Anyone with known risk factors for hepatitis C.  Sexually transmitted infections (STIs)  You should be screened for sexually transmitted infections (STIs) including gonorrhea and chlamydia if: ? You are sexually active and are younger than 54 years of age. ? You are older than 54 years of age and your health care provider tells you that you are at risk for this type of infection. ? Your sexual activity has changed since you were last screened and you are at an increased risk for chlamydia or gonorrhea. Ask your health care provider if you are at risk.  If you do not have HIV, but are at risk, it may be recommended that you take a prescription medicine daily to prevent HIV infection. This is called pre-exposure prophylaxis (PrEP). You are considered at risk if: ? You are sexually active and do not regularly use condoms or know the HIV status of your partner(s). ? You take drugs by injection. ? You are  sexually active with a partner who has HIV.  Talk with your health care provider about whether you are at high risk of being infected with HIV. If you choose to begin PrEP, you should first be tested for HIV. You should then be tested every 3 months for as long as you are taking PrEP. Pregnancy  If you are premenopausal and you may become pregnant, ask your health care provider about preconception counseling.  If you may become  pregnant, take 400 to 800 micrograms (mcg) of folic acid every day.  If you want to prevent pregnancy, talk to your health care provider about birth control (contraception). Osteoporosis and menopause  Osteoporosis is a disease in which the bones lose minerals and strength with aging. This can result in serious bone fractures. Your risk for osteoporosis can be identified using a bone density scan.  If you are 28 years of age or older, or if you are at risk for osteoporosis and fractures, ask your health care provider if you should be screened.  Ask your health care provider whether you should take a calcium or vitamin D supplement to lower your risk for osteoporosis.  Menopause may have certain physical symptoms and risks.  Hormone replacement therapy may reduce some of these symptoms and risks. Talk to your health care provider about whether hormone replacement therapy is right for you. Follow these instructions at home:  Schedule regular health, dental, and eye exams.  Stay current with your immunizations.  Do not use any tobacco products including cigarettes, chewing tobacco, or electronic cigarettes.  If you are pregnant, do not drink alcohol.  If you are breastfeeding, limit how much and how often you drink alcohol.  Limit alcohol intake to no more than 1 drink per day for nonpregnant women. One drink equals 12 ounces of beer, 5 ounces of wine, or 1 ounces of hard liquor.  Do not use street drugs.  Do not share needles.  Ask your health care  provider for help if you need support or information about quitting drugs.  Tell your health care provider if you often feel depressed.  Tell your health care provider if you have ever been abused or do not feel safe at home. This information is not intended to replace advice given to you by your health care provider. Make sure you discuss any questions you have with your health care provider. Document Released: 06/15/2011 Document Revised: 05/07/2016 Document Reviewed: 09/03/2015 Elsevier Interactive Patient Education  2018 [place] Maintenance for Postmenopausal Women Menopause is a normal process in which your reproductive ability comes to an end. This process happens gradually over a span of months to years, usually between the ages of 96 and 42. Menopause is complete when you have missed 12 consecutive menstrual periods. It is important to talk with your health care provider about some of the most common conditions that affect postmenopausal women, such as heart disease, cancer, and bone loss (osteoporosis). Adopting a healthy lifestyle and getting preventive care can help to promote your health and wellness. Those actions can also lower your chances of developing some of these common conditions. What should I know about menopause? During menopause, you may experience a number of symptoms, such as:  Moderate-to-severe hot flashes.  Night sweats.  Decrease in sex drive.  Mood swings.  Headaches.  Tiredness.  Irritability.  Memory problems.  Insomnia.  Choosing to treat or not to treat menopausal changes is an individual decision that you make with your health care provider. What should I know about hormone replacement therapy and supplements? Hormone therapy products are effective for treating symptoms that are associated with menopause, such as hot flashes and night sweats. Hormone replacement carries certain risks, especially as you become older. If you are  thinking about using estrogen or estrogen with progestin treatments, discuss the benefits and risks with your health care provider. What should I know about heart disease and stroke? Heart disease, heart attack, and stroke  become more likely as you age. This may be due, in part, to the hormonal changes that your body experiences during menopause. These can affect how your body processes dietary fats, triglycerides, and cholesterol. Heart attack and stroke are both medical emergencies. There are many things that you can do to help prevent heart disease and stroke:  Have your blood pressure checked at least every 1-2 years. High blood pressure causes heart disease and increases the risk of stroke.  If you are 55-79 years old, ask your health care provider if you should take aspirin to prevent a heart attack or a stroke.  Do not use any tobacco products, including cigarettes, chewing tobacco, or electronic cigarettes. If you need help quitting, ask your health care provider.  It is important to eat a healthy diet and maintain a healthy weight. ? Be sure to include plenty of vegetables, fruits, low-fat dairy products, and lean protein. ? Avoid eating foods that are high in solid fats, added sugars, or salt (sodium).  Get regular exercise. This is one of the most important things that you can do for your health. ? Try to exercise for at least 150 minutes each week. The type of exercise that you do should increase your heart rate and make you sweat. This is known as moderate-intensity exercise. ? Try to do strengthening exercises at least twice each week. Do these in addition to the moderate-intensity exercise.  Know your numbers.Ask your health care provider to check your cholesterol and your blood glucose. Continue to have your blood tested as directed by your health care provider.  What should I know about cancer screening? There are several types of cancer. Take the following steps to reduce  your risk and to catch any cancer development as early as possible. Breast Cancer  Practice breast self-awareness. ? This means understanding how your breasts normally appear and feel. ? It also means doing regular breast self-exams. Let your health care provider know about any changes, no matter how small.  If you are 40 or older, have a clinician do a breast exam (clinical breast exam or CBE) every year. Depending on your age, family history, and medical history, it may be recommended that you also have a yearly breast X-ray (mammogram).  If you have a family history of breast cancer, talk with your health care provider about genetic screening.  If you are at high risk for breast cancer, talk with your health care provider about having an MRI and a mammogram every year.  Breast cancer (BRCA) gene test is recommended for women who have family members with BRCA-related cancers. Results of the assessment will determine the need for genetic counseling and BRCA1 and for BRCA2 testing. BRCA-related cancers include these types: ? Breast. This occurs in males or females. ? Ovarian. ? Tubal. This may also be called fallopian tube cancer. ? Cancer of the abdominal or pelvic lining (peritoneal cancer). ? Prostate. ? Pancreatic.  Cervical, Uterine, and Ovarian Cancer Your health care provider may recommend that you be screened regularly for cancer of the pelvic organs. These include your ovaries, uterus, and vagina. This screening involves a pelvic exam, which includes checking for microscopic changes to the surface of your cervix (Pap test).  For women ages 21-65, health care providers may recommend a pelvic exam and a Pap test every three years. For women ages 30-65, they may recommend the Pap test and pelvic exam, combined with testing for human papilloma virus (HPV), every five years. Some types   of HPV increase your risk of cervical cancer. Testing for HPV may also be done on women of any age who  have unclear Pap test results.  Other health care providers may not recommend any screening for nonpregnant women who are considered low risk for pelvic cancer and have no symptoms. Ask your health care provider if a screening pelvic exam is right for you.  If you have had past treatment for cervical cancer or a condition that could lead to cancer, you need Pap tests and screening for cancer for at least 20 years after your treatment. If Pap tests have been discontinued for you, your risk factors (such as having a new sexual partner) need to be reassessed to determine if you should start having screenings again. Some women have medical problems that increase the chance of getting cervical cancer. In these cases, your health care provider may recommend that you have screening and Pap tests more often.  If you have a family history of uterine cancer or ovarian cancer, talk with your health care provider about genetic screening.  If you have vaginal bleeding after reaching menopause, tell your health care provider.  There are currently no reliable tests available to screen for ovarian cancer.  Lung Cancer Lung cancer screening is recommended for adults 20-34 years old who are at high risk for lung cancer because of a history of smoking. A yearly low-dose CT scan of the lungs is recommended if you:  Currently smoke.  Have a history of at least 30 pack-years of smoking and you currently smoke or have quit within the past 15 years. A pack-year is smoking an average of one pack of cigarettes per day for one year.  Yearly screening should:  Continue until it has been 15 years since you quit.  Stop if you develop a health problem that would prevent you from having lung cancer treatment.  Colorectal Cancer  This type of cancer can be detected and can often be prevented.  Routine colorectal cancer screening usually begins at age 91 and continues through age 40.  If you have risk factors for colon  cancer, your health care provider may recommend that you be screened at an earlier age.  If you have a family history of colorectal cancer, talk with your health care provider about genetic screening.  Your health care provider may also recommend using home test kits to check for hidden blood in your stool.  A small camera at the end of a tube can be used to examine your colon directly (sigmoidoscopy or colonoscopy). This is done to check for the earliest forms of colorectal cancer.  Direct examination of the colon should be repeated every 5-10 years until age 59. However, if early forms of precancerous polyps or small growths are found or if you have a family history or genetic risk for colorectal cancer, you may need to be screened more often.  Skin Cancer  Check your skin from head to toe regularly.  Monitor any moles. Be sure to tell your health care provider: ? About any new moles or changes in moles, especially if there is a change in a mole's shape or color. ? If you have a mole that is larger than the size of a pencil eraser.  If any of your family members has a history of skin cancer, especially at a young age, talk with your health care provider about genetic screening.  Always use sunscreen. Apply sunscreen liberally and repeatedly throughout the day.  Whenever you are outside, protect yourself by wearing long sleeves, pants, a wide-brimmed hat, and sunglasses.  What should I know about osteoporosis? Osteoporosis is a condition in which bone destruction happens more quickly than new bone creation. After menopause, you may be at an increased risk for osteoporosis. To help prevent osteoporosis or the bone fractures that can happen because of osteoporosis, the following is recommended:  If you are 13-49 years old, get at least 1,000 mg of calcium and at least 600 mg of vitamin D per day.  If you are older than age 83 but younger than age 38, get at least 1,200 mg of calcium and  at least 600 mg of vitamin D per day.  If you are older than age 77, get at least 1,200 mg of calcium and at least 800 mg of vitamin D per day.  Smoking and excessive alcohol intake increase the risk of osteoporosis. Eat foods that are rich in calcium and vitamin D, and do weight-bearing exercises several times each week as directed by your health care provider. What should I know about how menopause affects my mental health? Depression may occur at any age, but it is more common as you become older. Common symptoms of depression include:  Low or sad mood.  Changes in sleep patterns.  Changes in appetite or eating patterns.  Feeling an overall lack of motivation or enjoyment of activities that you previously enjoyed.  Frequent crying spells.  Talk with your health care provider if you think that you are experiencing depression. What should I know about immunizations? It is important that you get and maintain your immunizations. These include:  Tetanus, diphtheria, and pertussis (Tdap) booster vaccine.  Influenza every year before the flu season begins.  Pneumonia vaccine.  Shingles vaccine.  Your health care provider may also recommend other immunizations. This information is not intended to replace advice given to you by your health care provider. Make sure you discuss any questions you have with your health care provider. Document Released: 01/22/2006 Document Revised: 06/19/2016 Document Reviewed: 09/03/2015 Elsevier Interactive Patient Education  2018 [employer].

## 2018-04-20 NOTE — Assessment & Plan Note (Signed)
Under good control with A1c of 5.8. Continue diet and exercise. Call with any concerns.

## 2018-04-20 NOTE — Assessment & Plan Note (Signed)
Stable. Rechecking levels today. Continue current regimen. Continue to monitor. Call with any concerns.

## 2018-04-20 NOTE — Assessment & Plan Note (Signed)
Under fair control on recheck. Will work on [employer]. Refills given. Call with any concerns.

## 2018-04-20 NOTE — Assessment & Plan Note (Signed)
Rechecking levels today. Await results. Treat as needed. Call with any concerns.  

## 2018-04-20 NOTE — Progress Notes (Signed)
BP (!) 138/92 (BP Location: Left Arm, Cuff Size: Normal)   Pulse 62   Temp 98.8 F (37.1 C)   Ht 5' 2.4" (1.585 m)   Wt 196 lb 4 oz (89 kg)   LMP  (LMP Unknown)   SpO2 100%   BMI 35.44 kg/m    Subjective:    Patient ID: Sherry Patel, female    DOB: 07-21-1966, 52 y.o.   MRN: [id number]  HPI: Sherry Patel is a 52 y.o. female presenting on 04/20/2018 for comprehensive medical examination. Current medical complaints include:  HYPERTENSION / HYPERLIPIDEMIA Satisfied with current treatment? yes Duration of hypertension: chronic BP monitoring frequency: not checking BP medication side effects: no Past BP meds: amlodipine, metoprolol Duration of hyperlipidemia: chronic Cholesterol medication side effects: no Cholesterol supplements: none Past cholesterol medications: atorvastatin Medication compliance: excellent compliance Aspirin: no Recent stressors: no Recurrent headaches: no Visual changes: no Palpitations: yes Dyspnea: no Chest pain: no Lower extremity edema: no Dizzy/lightheaded: no  Impaired Fasting Glucose HbA1C:  Duration of elevated blood sugar: chronic Polydipsia: no Polyuria: no Weight change: no Visual disturbance: no Glucose Monitoring: no   HYPOTHYROIDISM Thyroid control status:controlled Satisfied with current treatment? yes Medication side effects: no Medication compliance: excellent compliance Etiology of hypothyroidism:  Recent dose adjustment:no Fatigue: no Cold intolerance: no Heat intolerance: no Weight gain: no Weight loss: no Constipation: no Diarrhea/loose stools: no Palpitations: yes Lower extremity edema: no Anxiety/depressed mood: no  Menopausal Symptoms: yes- hot flashes and night sweats, doing better  Depression Screen done today and results listed below:  Depression screen [facility] 2/9 04/20/2018 09/27/2015  Decreased Interest 0 0  Down, Depressed, Hopeless 0 0  PHQ - 2 Score 0 0  Altered sleeping 1 -  Tired, decreased  energy 1 -  Change in appetite 0 -  Feeling bad or failure about yourself  0 -  Trouble concentrating 0 -  Moving slowly or fidgety/restless 0 -  Suicidal thoughts 0 -  PHQ-9 Score 2 -  Difficult doing work/chores Not difficult at all -    Past Medical History:  Past Medical History:  Diagnosis Date  . Hyperlipidemia   . Hypertension 2004   gestational   . IFG (impaired fasting glucose)   . Mammographic microcalcification 2012  . Thyroid disease     Surgical History:  Past Surgical History:  Procedure Laterality Date  . breast biopy Left 20 years ago    Medications:  Current Outpatient Medications on File Prior to Visit  Medication Sig  . levothyroxine (SYNTHROID, LEVOTHROID) 50 MCG tablet Take 1 tablet (50 mcg total) by mouth daily before breakfast. (Patient not taking: Reported on 05/25/2017)   No current facility-administered medications on file prior to visit.     Allergies:  No Active Allergies  Social History:  Social History   Socioeconomic History  . Marital status: Married    Spouse name: Not on file  . Number of children: Not on file  . Years of education: Not on file  . Highest education level: Not on file  Occupational History  . Not on file  Social Needs  . Financial resource strain: Not on file  . Food insecurity:    Worry: Not on file    Inability: Not on file  . Transportation needs:    Medical: Not on file    Non-medical: Not on file  Tobacco Use  . Smoking status: Former Smoker    Packs/day: 1.00    Years: 20.00  Pack years: 20.00  . Smokeless tobacco: Never Used  Substance and Sexual Activity  . Alcohol use: No  . Drug use: No  . Sexual activity: Yes  Lifestyle  . Physical activity:    Days per week: Not on file    Minutes per session: Not on file  . Stress: Not on file  Relationships  . Social connections:    Talks on phone: Not on file    Gets together: Not on file    Attends religious service: Not on file    Active  member of club or organization: Not on file    Attends meetings of clubs or organizations: Not on file    Relationship status: Not on file  . Intimate partner violence:    Fear of current or ex partner: Not on file    Emotionally abused: Not on file    Physically abused: Not on file    Forced sexual activity: Not on file  Other Topics Concern  . Not on file  Social History Narrative  . Not on file   Social History   Tobacco Use  Smoking Status Former Smoker  . Packs/day: 1.00  . Years: 20.00  . Pack years: 20.00  Smokeless Tobacco Never Used   Social History   Substance and Sexual Activity  Alcohol Use No    Family History:  Family History  Problem Relation Age of Onset  . Hypertension Mother   . Hypertension Maternal Grandmother   . Hypertension Maternal Grandfather     Past medical history, surgical history, medications, allergies, family history and social history reviewed with patient today and changes made to appropriate areas of the chart.   Review of Systems  Constitutional: Positive for diaphoresis. Negative for chills, fever, malaise/fatigue and weight loss.  HENT: Negative.   Eyes: Negative.   Respiratory: Negative.   Cardiovascular: Negative.   Gastrointestinal: Negative.   Genitourinary: Negative.   Musculoskeletal: Positive for myalgias. Negative for back pain, falls, joint pain and neck pain.  Skin: Positive for itching and rash.  Neurological: Positive for dizziness (occasionally). Negative for tingling, tremors, sensory change, speech change, focal weakness, seizures, loss of consciousness, weakness and headaches.  Endo/Heme/Allergies: Negative.   Psychiatric/Behavioral: Negative.     All other ROS negative except what is listed above and in the HPI.      Objective:    BP (!) 138/92 (BP Location: Left Arm, Cuff Size: Normal)   Pulse 62   Temp 98.8 F (37.1 C)   Ht 5' 2.4" (1.585 m)   Wt 196 lb 4 oz (89 kg)   LMP  (LMP Unknown)   SpO2  100%   BMI 35.44 kg/m   Wt Readings from Last 3 Encounters:  04/20/18 196 lb 4 oz (89 kg)  10/19/17 194 lb (88 kg)  05/25/17 193 lb 6.4 oz (87.7 kg)    Physical Exam  Constitutional: She is oriented to person, place, and time. She appears well-developed and well-nourished. No distress.  HENT:  Head: Normocephalic and atraumatic.  Right Ear: Hearing, tympanic membrane, external ear and ear canal normal.  Left Ear: Hearing, tympanic membrane, external ear and ear canal normal.  Nose: Nose normal.  Mouth/Throat: Uvula is midline, oropharynx is clear and moist and mucous membranes are normal. No oropharyngeal exudate.  Eyes: Pupils are equal, round, and reactive to light. Conjunctivae, EOM and lids are normal. Right eye exhibits no discharge. Left eye exhibits no discharge. No scleral icterus.  Neck: Normal range of  motion. Neck supple. No JVD present. No tracheal deviation present. No thyromegaly present.  Cardiovascular: Normal rate, regular rhythm, normal heart sounds and intact distal pulses. Exam reveals no gallop and no friction rub.  No murmur heard. Pulmonary/Chest: Effort normal. No stridor. No respiratory distress. She has no wheezes. She has no rales. She exhibits no tenderness. Right breast exhibits no inverted nipple, no mass, no nipple discharge, no skin change and no tenderness. Left breast exhibits no inverted nipple, no mass, no nipple discharge, no skin change and no tenderness. No breast swelling, tenderness, discharge or bleeding. Breasts are symmetrical.  Abdominal: Soft. Bowel sounds are normal. She exhibits no distension and no mass. There is no tenderness. There is no rebound and no guarding. No hernia.  Genitourinary:  Genitourinary Comments: Pelvic exam deferred with shared decision making  Musculoskeletal: Normal range of motion. She exhibits no edema, tenderness or deformity.  Lymphadenopathy:    She has no cervical adenopathy.  Neurological: She is alert and  oriented to person, place, and time. She displays normal reflexes. No cranial nerve deficit or sensory deficit. She exhibits normal muscle tone. Coordination normal.  Skin: Skin is warm, dry and intact. Capillary refill takes less than 2 seconds. Rash (hyperpigmented patches on trunk and arms) noted. She is not diaphoretic. No erythema. No pallor.  Psychiatric: She has a normal mood and affect. Her speech is normal and behavior is normal. Judgment and thought content normal. Cognition and memory are normal.  Nursing note and vitals reviewed.   Results for orders placed or performed in visit on 10/19/17  Bayer DCA Hb A1c Waived  Result Value Ref Range   Bayer DCA Hb A1c Waived 6.0 <7.0 %  CBC with Differential/Platelet  Result Value Ref Range   WBC 8.2 3.4 - 10.8 x10E3/uL   RBC 4.29 3.77 - 5.28 x10E6/uL   Hemoglobin 12.4 11.1 - 15.9 g/dL   Hematocrit 81.1 91.4 - 46.6 %   MCV 89 79 - 97 fL   MCH 28.9 26.6 - 33.0 pg   MCHC 32.5 31.5 - 35.7 g/dL   RDW 78.2 95.6 - 21.3 %   Platelets 347 150 - 379 x10E3/uL   Neutrophils 56 Not Estab. %   Lymphs 37 Not Estab. %   Monocytes 5 Not Estab. %   Eos 2 Not Estab. %   Basos 0 Not Estab. %   Neutrophils Absolute 4.6 1.4 - 7.0 x10E3/uL   Lymphocytes Absolute 3.0 0.7 - 3.1 x10E3/uL   Monocytes Absolute 0.4 0.1 - 0.9 x10E3/uL   EOS (ABSOLUTE) 0.1 0.0 - 0.4 x10E3/uL   Basophils Absolute 0.0 0.0 - 0.2 x10E3/uL   Immature Granulocytes 0 Not Estab. %   Immature Grans (Abs) 0.0 0.0 - 0.1 x10E3/uL  Comprehensive metabolic panel  Result Value Ref Range   Glucose 94 65 - 99 mg/dL   BUN 14 6 - 24 mg/dL   Creatinine, Ser 0.86 0.57 - 1.00 mg/dL   GFR calc non Af Amer 94 >59 mL/min/1.73   GFR calc Af Amer 109 >59 mL/min/1.73   BUN/Creatinine Ratio 19 9 - 23   Sodium 138 134 - 144 mmol/L   Potassium 4.5 3.5 - 5.2 mmol/L   Chloride 101 96 - 106 mmol/L   CO2 23 20 - 29 mmol/L   Calcium 9.4 8.7 - 10.2 mg/dL   Total Protein 7.7 6.0 - 8.5 g/dL   Albumin  4.3 3.5 - 5.5 g/dL   Globulin, Total 3.4 1.5 - 4.5 g/dL  Albumin/Globulin Ratio 1.3 1.2 - 2.2   Bilirubin Total 0.2 0.0 - 1.2 mg/dL   Alkaline Phosphatase 73 39 - 117 IU/L   AST 11 0 - 40 IU/L   ALT 9 0 - 32 IU/L  Lipid Panel w/o Chol/HDL Ratio  Result Value Ref Range   Cholesterol, Total 194 100 - 199 mg/dL   Triglycerides 86 0 - 149 mg/dL   HDL 46 >16 mg/dL   VLDL Cholesterol Cal 17 5 - 40 mg/dL   LDL Calculated 109 (H) 0 - 99 mg/dL  TSH  Result Value Ref Range   TSH 6.980 (H) 0.450 - 4.500 uIU/mL      Assessment & Plan:   Problem List Items Addressed This Visit      Endocrine   IFG (impaired fasting glucose)    Under good control with A1c of 5.8. Continue diet and exercise. Call with any concerns.       Relevant Orders   Bayer DCA Hb A1c Waived   CBC with Differential/Platelet   Comprehensive metabolic panel   Microalbumin, Urine Waived   UA/M w/rflx Culture, Routine   Thyroid activity decreased    Rechecking levels today. Await results. Treat as needed. Call with any concerns.       Relevant Medications   metoprolol succinate (TOPROL-XL) 100 MG 24 hr tablet   Other Relevant Orders   CBC with Differential/Platelet   Comprehensive metabolic panel   TSH   UA/M w/rflx Culture, Routine     Genitourinary   Hypertensive CKD (chronic kidney disease)    Under fair control on recheck. Will work on [employer]. Refills given. Call with any concerns.       Relevant Orders   CBC with Differential/Platelet   Comprehensive metabolic panel   Microalbumin, Urine Waived   UA/M w/rflx Culture, Routine     Other   Hyperlipidemia    Stable. Rechecking levels today. Continue current regimen. Continue to monitor. Call with any concerns.       Relevant Medications   metoprolol succinate (TOPROL-XL) 100 MG 24 hr tablet   atorvastatin (LIPITOR) 10 MG tablet   amLODipine (NORVASC) 5 MG tablet   Other Relevant Orders   CBC with Differential/Platelet   Comprehensive  metabolic panel   Lipid Panel w/o Chol/HDL Ratio   UA/M w/rflx Culture, Routine    Other Visit Diagnoses    Routine general medical examination at a health care facility    -  Primary   Vaccines up to date. Screening labs checked today. Will check on cologuard. Mammo and pap up to date. Continue diet and exercise.    Relevant Orders   Bayer DCA Hb A1c Waived   CBC with Differential/Platelet   Comprehensive metabolic panel   Lipid Panel w/o Chol/HDL Ratio   Microalbumin, Urine Waived   TSH   UA/M w/rflx Culture, Routine   Tinea corporis       Will treat with lotrisone. Call if not getting better or getting worse.    Relevant Medications   clotrimazole-betamethasone (LOTRISONE) cream   Screening for breast cancer       Mammogram ordered today- due in November.    Relevant Orders   MM DIGITAL SCREENING BILATERAL       Follow up plan: Return in about 6 months (around 10/21/2018) for Follow up.   LABORATORY TESTING:  - Pap smear: up to date  IMMUNIZATIONS:   - Tdap: Tetanus vaccination status reviewed: last tetanus booster within 10 years. - Influenza:  Postponed to flu season - Pneumovax: Not applicable - Prevnar: Not applicable - HPV: Not applicable - Zostavax vaccine: Not applicable  SCREENING: -Mammogram: Ordered today  - Colonoscopy: Will consider cologuard   PATIENT COUNSELING:   Advised to take 1 mg of folate supplement per day if capable of pregnancy.   Sexuality: Discussed sexually transmitted diseases, partner selection, use of condoms, avoidance of unintended pregnancy  and contraceptive alternatives.   Advised to avoid cigarette smoking.  I discussed with the patient that most people either abstain from alcohol or drink within safe limits (<=14/week and <=4 drinks/occasion for males, <=7/weeks and <= 3 drinks/occasion for females) and that the risk for alcohol disorders and other health effects rises proportionally with the number of drinks per week and how  often a drinker exceeds daily limits.  Discussed cessation/primary prevention of drug use and availability of treatment for abuse.   Diet: Encouraged to adjust caloric intake to maintain  or achieve ideal body weight, to reduce intake of dietary saturated fat and total fat, to limit sodium intake by avoiding high sodium foods and not adding table salt, and to maintain adequate dietary potassium and calcium preferably from fresh fruits, vegetables, and low-fat dairy products.    stressed the importance of regular exercise  Injury prevention: Discussed safety belts, safety helmets, smoke detector, smoking near bedding or upholstery.   Dental health: Discussed importance of regular tooth brushing, flossing, and dental visits.    NEXT PREVENTATIVE PHYSICAL DUE IN 1 YEAR. Return in about 6 months (around 10/21/2018) for Follow up.

## 2018-04-21 LAB — LIPID PANEL W/O CHOL/HDL RATIO
CHOLESTEROL TOTAL: 165 mg/dL (ref 100–199)
HDL: 44 mg/dL (ref >39)
LDL Calculated: 105 mg/dL — ABNORMAL HIGH (ref 0–99)
TRIGLYCERIDES: 82 mg/dL (ref 0–149)
VLDL Cholesterol Cal: 16 mg/dL (ref 5–40)

## 2018-04-21 LAB — CBC WITH DIFFERENTIAL/PLATELET
Basophils Absolute: 0 10*3/uL (ref 0.0–0.2)
Basos: 0 %
EOS (ABSOLUTE): 0.2 10*3/uL (ref 0.0–0.4)
EOS: 2 %
HEMATOCRIT: 37.9 % (ref 34.0–46.6)
Hemoglobin: 12.3 g/dL (ref 11.1–15.9)
Immature Grans (Abs): 0 10*3/uL (ref 0.0–0.1)
Immature Granulocytes: 0 %
LYMPHS ABS: 2.6 10*3/uL (ref 0.7–3.1)
Lymphs: 32 %
MCH: 28.3 pg (ref 26.6–33.0)
MCHC: 32.5 g/dL (ref 31.5–35.7)
MCV: 87 fL (ref 79–97)
MONOS ABS: 0.6 10*3/uL (ref 0.1–0.9)
Monocytes: 7 %
NEUTROS ABS: 4.8 10*3/uL (ref 1.4–7.0)
Neutrophils: 59 %
Platelets: 359 10*3/uL (ref 150–379)
RBC: 4.34 x10E6/uL (ref 3.77–5.28)
RDW: 15.6 % — AB (ref 12.3–15.4)
WBC: 8.1 10*3/uL (ref 3.4–10.8)

## 2018-04-21 LAB — COMPREHENSIVE METABOLIC PANEL
A/G RATIO: 1.4 (ref 1.2–2.2)
ALBUMIN: 4.3 g/dL (ref 3.5–5.5)
ALK PHOS: 70 IU/L (ref 39–117)
ALT: 13 IU/L (ref 0–32)
AST: 13 IU/L (ref 0–40)
BILIRUBIN TOTAL: 0.2 mg/dL (ref 0.0–1.2)
BUN / CREAT RATIO: 16 (ref 9–23)
BUN: 12 mg/dL (ref 6–24)
CHLORIDE: 103 mmol/L (ref 96–106)
CO2: 24 mmol/L (ref 20–29)
Calcium: 9.6 mg/dL (ref 8.7–10.2)
Creatinine, Ser: 0.75 mg/dL (ref 0.57–1.00)
GFR calc Af Amer: 105 mL/min/{1.73_m2} (ref >59)
GFR calc non Af Amer: 91 mL/min/{1.73_m2} (ref >59)
Globulin, Total: 3 g/dL (ref 1.5–4.5)
Glucose: 82 mg/dL (ref 65–99)
POTASSIUM: 4.6 mmol/L (ref 3.5–5.2)
Sodium: 141 mmol/L (ref 134–144)
Total Protein: 7.3 g/dL (ref 6.0–8.5)

## 2018-04-21 LAB — TSH: TSH: 8.01 u[IU]/mL — ABNORMAL HIGH (ref 0.450–4.500)

## 2018-04-22 ENCOUNTER — Telehealth: Payer: Self-pay | Admitting: Family Medicine

## 2018-04-22 DIAGNOSIS — E039 Hypothyroidism, unspecified: Secondary | ICD-10-CM

## 2018-04-22 NOTE — Telephone Encounter (Signed)
Please let her know that her labs came back normal, but her thyroid came back under active. If she is not taking her thyroid medicine, she should restart it and we should recheck it in 6 weeks. Everything else looks good.

## 2018-04-22 NOTE — Telephone Encounter (Signed)
Left message on machine for pt to return call to the office.  

## 2018-04-25 NOTE — Telephone Encounter (Signed)
Patient notified .Patient understood .

## 2018-07-13 ENCOUNTER — Other Ambulatory Visit: Payer: Self-pay | Admitting: Family Medicine

## 2018-07-13 DIAGNOSIS — B354 Tinea corporis: Secondary | ICD-10-CM

## 2018-07-14 NOTE — Telephone Encounter (Signed)
Lotrisone cream   LOV 04/20/18 with Dr. Laural BenesJohnson  [facility] [address] - [place]

## 2018-07-27 ENCOUNTER — Ambulatory Visit: Payer: Managed Care, Other (non HMO) | Admitting: Family Medicine

## 2018-07-27 ENCOUNTER — Encounter: Payer: Self-pay | Admitting: Family Medicine

## 2018-07-27 VITALS — BP 124/82 | HR 73 | Temp 98.5°F | Resp 14 | Ht 62.0 in | Wt 193.2 lb

## 2018-07-27 DIAGNOSIS — I129 Hypertensive chronic kidney disease with stage 1 through stage 4 chronic kidney disease, or unspecified chronic kidney disease: Secondary | ICD-10-CM

## 2018-07-27 DIAGNOSIS — E039 Hypothyroidism, unspecified: Secondary | ICD-10-CM

## 2018-07-27 DIAGNOSIS — E7800 Pure hypercholesterolemia, unspecified: Secondary | ICD-10-CM | POA: Diagnosis not present

## 2018-07-27 DIAGNOSIS — Z6835 Body mass index (BMI) 35.0-35.9, adult: Secondary | ICD-10-CM

## 2018-07-27 DIAGNOSIS — R7301 Impaired fasting glucose: Secondary | ICD-10-CM

## 2018-07-27 MED ORDER — LEVOTHYROXINE SODIUM 50 MCG PO TABS: 50.0000 ug | ORAL_TABLET | Freq: Every day | ORAL | 0 refills | Status: DC

## 2018-07-27 NOTE — Patient Instructions (Signed)
DASH Eating Plan DASH stands for "Dietary Approaches to Stop Hypertension." The DASH eating plan is a healthy eating plan that has been shown to reduce high blood pressure (hypertension). It may also reduce your risk for type 2 diabetes, heart disease, and stroke. The DASH eating plan may also help with weight loss. What are tips for following this plan? General guidelines  Avoid eating more than 2,300 mg (milligrams) of salt (sodium) a day. If you have hypertension, you may need to reduce your sodium intake to 1,500 mg a day.  Limit alcohol intake to no more than 1 drink a day for nonpregnant women and 2 drinks a day for men. One drink equals 12 oz of beer, 5 oz of wine, or 1 oz of hard liquor.  Work with your health care provider to maintain a healthy body weight or to lose weight. Ask what an ideal weight is for you.  Get at least 30 minutes of exercise that causes your heart to beat faster (aerobic exercise) most days of the week. Activities may include walking, swimming, or biking.  Work with your health care provider or diet and nutrition specialist (dietitian) to adjust your eating plan to your individual calorie needs. Reading food labels  Check food labels for the amount of sodium per serving. Choose foods with less than 5 percent of the Daily Value of sodium. Generally, foods with less than 300 mg of sodium per serving fit into this eating plan.  To find whole grains, look for the word "whole" as the first word in the ingredient list. Shopping  Buy products labeled as "low-sodium" or "no salt added."  Buy fresh foods. Avoid canned foods and premade or frozen meals. Cooking  Avoid adding salt when cooking. Use salt-free seasonings or herbs instead of table salt or sea salt. Check with your health care provider or pharmacist before using salt substitutes.  Do not fry foods. Cook foods using healthy methods such as baking, boiling, grilling, and broiling instead.  Cook with  heart-healthy oils, such as olive, canola, soybean, or sunflower oil. Meal planning   Eat a balanced diet that includes: ? 5 or more servings of fruits and vegetables each day. At each meal, try to fill half of your plate with fruits and vegetables. ? Up to 6-8 servings of whole grains each day. ? Less than 6 oz of lean meat, poultry, or fish each day. A 3-oz serving of meat is about the same size as a deck of cards. One egg equals 1 oz. ? 2 servings of low-fat dairy each day. ? A serving of nuts, seeds, or beans 5 times each week. ? Heart-healthy fats. Healthy fats called Omega-3 fatty acids are found in foods such as flaxseeds and coldwater fish, like sardines, salmon, and mackerel.  Limit how much you eat of the following: ? Canned or prepackaged foods. ? Food that is high in trans fat, such as fried foods. ? Food that is high in saturated fat, such as fatty meat. ? Sweets, desserts, sugary drinks, and other foods with added sugar. ? Full-fat dairy products.  Do not salt foods before eating.  Try to eat at least 2 vegetarian meals each week.  Eat more home-cooked food and less restaurant, buffet, and fast food.  When eating at a restaurant, ask that your food be prepared with less salt or no salt, if possible. What foods are recommended? The items listed may not be a complete list. Talk with your dietitian about what   dietary choices are best for you. Grains Whole-grain or whole-wheat bread. Whole-grain or whole-wheat pasta. Brown rice. Oatmeal. Quinoa. Bulgur. Whole-grain and low-sodium cereals. Pita bread. Low-fat, low-sodium crackers. Whole-wheat flour tortillas. Vegetables Fresh or frozen vegetables (raw, steamed, roasted, or grilled). Low-sodium or reduced-sodium tomato and vegetable juice. Low-sodium or reduced-sodium tomato sauce and tomato paste. Low-sodium or reduced-sodium canned vegetables. Fruits All fresh, dried, or frozen fruit. Canned fruit in natural juice (without  added sugar). Meat and other protein foods Skinless chicken or turkey. Ground chicken or turkey. Pork with fat trimmed off. Fish and seafood. Egg whites. Dried beans, peas, or lentils. Unsalted nuts, nut butters, and seeds. Unsalted canned beans. Lean cuts of beef with fat trimmed off. Low-sodium, lean deli meat. Dairy Low-fat (1%) or fat-free (skim) milk. Fat-free, low-fat, or reduced-fat cheeses. Nonfat, low-sodium ricotta or cottage cheese. Low-fat or nonfat yogurt. Low-fat, low-sodium cheese. Fats and oils Soft margarine without trans fats. Vegetable oil. Low-fat, reduced-fat, or light mayonnaise and salad dressings (reduced-sodium). Canola, safflower, olive, soybean, and sunflower oils. Avocado. Seasoning and other foods Herbs. Spices. Seasoning mixes without salt. Unsalted popcorn and pretzels. Fat-free sweets. What foods are not recommended? The items listed may not be a complete list. Talk with your dietitian about what dietary choices are best for you. Grains Baked goods made with fat, such as croissants, muffins, or some breads. Dry pasta or rice meal packs. Vegetables Creamed or fried vegetables. Vegetables in a cheese sauce. Regular canned vegetables (not low-sodium or reduced-sodium). Regular canned tomato sauce and paste (not low-sodium or reduced-sodium). Regular tomato and vegetable juice (not low-sodium or reduced-sodium). Pickles. Olives. Fruits Canned fruit in a light or heavy syrup. Fried fruit. Fruit in cream or butter sauce. Meat and other protein foods Fatty cuts of meat. Ribs. Fried meat. Bacon. Sausage. Bologna and other processed lunch meats. Salami. Fatback. Hotdogs. Bratwurst. Salted nuts and seeds. Canned beans with added salt. Canned or smoked fish. Whole eggs or egg yolks. Chicken or turkey with skin. Dairy Whole or 2% milk, cream, and half-and-half. Whole or full-fat cream cheese. Whole-fat or sweetened yogurt. Full-fat cheese. Nondairy creamers. Whipped toppings.  Processed cheese and cheese spreads. Fats and oils Butter. Stick margarine. Lard. Shortening. Ghee. Bacon fat. Tropical oils, such as coconut, palm kernel, or palm oil. Seasoning and other foods Salted popcorn and pretzels. Onion salt, garlic salt, seasoned salt, table salt, and sea salt. Worcestershire sauce. Tartar sauce. Barbecue sauce. Teriyaki sauce. Soy sauce, including reduced-sodium. Steak sauce. Canned and packaged gravies. Fish sauce. Oyster sauce. Cocktail sauce. Horseradish that you find on the shelf. Ketchup. Mustard. Meat flavorings and tenderizers. Bouillon cubes. Hot sauce and Tabasco sauce. Premade or packaged marinades. Premade or packaged taco seasonings. Relishes. Regular salad dressings. Where to find more information:  National Heart, Lung, and Blood Institute: www.nhlbi.nih.gov  American Heart Association: www.heart.org Summary  The DASH eating plan is a healthy eating plan that has been shown to reduce high blood pressure (hypertension). It may also reduce your risk for type 2 diabetes, heart disease, and stroke.  With the DASH eating plan, you should limit salt (sodium) intake to 2,300 mg a day. If you have hypertension, you may need to reduce your sodium intake to 1,500 mg a day.  When on the DASH eating plan, aim to eat more fresh fruits and vegetables, whole grains, lean proteins, low-fat dairy, and heart-healthy fats.  Work with your health care provider or diet and nutrition specialist (dietitian) to adjust your eating plan to your individual   calorie needs. This information is not intended to replace advice given to you by your health care provider. Make sure you discuss any questions you have with your health care provider. Document Released: 11/19/2011 Document Revised: 11/23/2016 Document Reviewed: 11/23/2016 Elsevier Interactive Patient Education  2018 [employer]  

## 2018-07-27 NOTE — Progress Notes (Signed)
Name: Sherry Patel   MRN: [id number]    DOB: 05/12/1966   Date:07/27/2018       Progress Note  Subjective  Chief Complaint  Chief Complaint  Patient presents with  . Establish Care  . Hypothyroidism    follow up, medication refill    HPI  Pt presents to establish care with our office today - records are available in chart from previous PCP.    Social: She works as accounts payable for [employer];   Hypothyroidism: In may 2019 she had elevated TSH because she had stopped taking her synthroid; she has been back on synthroid 50mcg since then, but has not had her levels rechecked.   She has been on synthroid since 2016 - has not had US, no history of thyroid nodules, no history of hyperthyroidism, no family history of thyroid cancer or thyroid issues.  Has had palpitations intermittently for many years - they occur when she's stressed. Denies hair/skin/nail changes, denies heat/cold intolerance.  HTN: Checks BP at home - usually 130's/80's.  BP is at goal today; CMP from 04/20/2018 is normal.  She denies headaches, dizziness, chest pain, shortness of breath.  Does endorse occasional Bilateral foot swelling - goes away with elevation. Does not avoid salt in the diet at this time.  CKD: Had elevated microalbumin in May 2019; kidney function appears to have been stable since 2016 (this is as far back as the records show).  Most recent CMP was 04/20/2018 and was normal.   Hyperlipidemia: Current Medication Regimen: Lipitor 10mg  for 2-3 years.   - Last check was May 2019 and showed slightly elevated LDL. - Current Diet: Tries to avoid fried foods as much as possible;  - Denies: Chest pain, shortness of breath, myalgias. - Documented aortic atherosclerosis? No - Risk factors for atherosclerosis: hypercholesterolemia and hypertension  Obesity: Body mass index is 35.34 kg/m. Diet: Breakfast - boiled eggs, bacon, string cheese; Lunch - baked chicken, potatoes, green beans or salad (2x/week),  Dinner - skips; Pork rinds, stouffer's sugar free chocolate, may get a candy bar every now and then.  Drinks diet drinks or water.  Exercise: moderately active - walks on her break 3 times a week for about 15 minutes. Co-Morbid Conditions: dyslipidemias and hypertension; 2 or more of these conditions combined with BMI >30 is considered morbid obesity; is this diagnosis appropriate and/or added to patient's problem list? Yes   Impaired Fasting Glucose: Does not check BG's at home; last CMP BG was 82; denies polyphagia, polyuria, or polydipsia.  Patient Active Problem List   Diagnosis Date Noted  . Thyroid activity decreased 12/31/2015  . Hyperlipidemia 09/25/2015  . IFG (impaired fasting glucose) 09/25/2015  . Hypertensive CKD (chronic kidney disease) 09/25/2015  . Obesity 09/25/2015  . Menopause 09/25/2015    Past Surgical History:  Procedure Laterality Date  . breast biopy Left 20 years ago    Family History  Problem Relation Age of Onset  . Hypertension Mother   . Hypertension Maternal Grandmother   . Hypertension Maternal Grandfather     Social History   Socioeconomic History  . Marital status: Married    Spouse name: Not on file  . Number of children: 4  . Years of education: Not on file  . Highest education level: Not on file  Occupational History  . Occupation: Educational psychologistAccounts payable    Employer: [employer]  Social Needs  . Financial resource strain: Not hard at all  . Food insecurity:    Worry:  Never true    Inability: Never true  . Transportation needs:    Medical: No    Non-medical: No  Tobacco Use  . Smoking status: Former Smoker    Packs/day: 1.00    Years: 20.00    Pack years: 20.00  . Smokeless tobacco: Never Used  Substance and Sexual Activity  . Alcohol use: No  . Drug use: No  . Sexual activity: Yes  Lifestyle  . Physical activity:    Days per week: 3 days    Minutes per session: 30 min  . Stress: Not at all  Relationships  . Social connections:      Talks on phone: More than three times a week    Gets together: More than three times a week    Attends religious service: More than 4 times per year    Active member of club or organization: Yes    Attends meetings of clubs or organizations: More than 4 times per year    Relationship status: Married  . Intimate partner violence:    Fear of current or ex partner: Not on file    Emotionally abused: Not on file    Physically abused: Not on file    Forced sexual activity: Not on file  Other Topics Concern  . Not on file  Social History Narrative  . Not on file     Current Outpatient Medications:  .  amLODipine (NORVASC) 5 MG tablet, Take 1 tablet (5 mg total) by mouth daily., Disp: 90 tablet, Rfl: 3 .  atorvastatin (LIPITOR) 10 MG tablet, Take 1 tablet (10 mg total) by mouth daily at 6 PM., Disp: 90 tablet, Rfl: 1 .  levothyroxine (SYNTHROID, LEVOTHROID) 50 MCG tablet, Take 1 tablet (50 mcg total) by mouth daily before breakfast., Disp: 30 tablet, Rfl: 0 .  metoprolol succinate (TOPROL-XL) 100 MG 24 hr tablet, Take 1 tablet (100 mg total) by mouth daily. Take with or immediately following a meal., Disp: 90 tablet, Rfl: 1 .  clotrimazole-betamethasone (LOTRISONE) cream, APPLY  CREAM TOPICALLY TO AFFECTED AREA TWICE DAILY (Patient not taking: Reported on 07/27/2018), Disp: 45 g, Rfl: 1  No Active Allergies  ROS Constitutional: Negative for fever or weight change.  Respiratory: Negative for cough and shortness of breath.   Cardiovascular: Negative for chest pain or palpitations.  Gastrointestinal: Negative for abdominal pain, no bowel changes.  Musculoskeletal: Negative for gait problem or joint swelling.  Skin: Negative for rash.  Neurological: Negative for dizziness or headache.  No other specific complaints in a complete review of systems (except as listed in HPI above).  Objective  Vitals:   07/27/18 0909  BP: 124/82  Pulse: 73  Resp: 14  Temp: 98.5 F (36.9 C)  TempSrc:  Oral  SpO2: 99%  Weight: 193 lb 3.2 oz (87.6 kg)  Height: 5\' 2"  (1.575 m)   Body mass index is 35.34 kg/m.  Physical Exam  Constitutional: She is oriented to person, place, and time. She appears well-developed and well-nourished. No distress.  HENT:  Head: Normocephalic and atraumatic.  Right Ear: External ear normal.  Left Ear: External ear normal.  Nose: Nose normal.  Mouth/Throat: Oropharynx is clear and moist. No oropharyngeal exudate.  Eyes: Pupils are equal, round, and reactive to light. Conjunctivae and EOM are normal.  Neck: Normal range of motion. Neck supple. No JVD present. No thyromegaly (no nodule palpated) present.  Cardiovascular: Normal rate, regular rhythm and normal heart sounds.  Pulmonary/Chest: Effort normal and breath sounds  normal.  Musculoskeletal: Normal range of motion. She exhibits no edema or tenderness.  Neurological: She is alert and oriented to person, place, and time. No cranial nerve deficit.  Skin: Skin is warm and dry. No rash noted.  Psychiatric: She has a normal mood and affect. Her behavior is normal. Judgment and thought content normal.  Nursing note and vitals reviewed.   No results found for this or any previous visit (from the past 72 hour(s)).   PHQ2/9: Depression screen [facility] 07/27/2018 04/20/2018 09/27/2015  Decreased Interest 0 0 0  Down, Depressed, Hopeless 0 0 0  PHQ - 2 Score 0 0 0  Altered sleeping 0 1 -  Tired, decreased energy 1 1 -  Change in appetite 0 0 -  Feeling bad or failure about yourself  0 0 -  Trouble concentrating 1 0 -  Moving slowly or fidgety/restless 0 0 -  Suicidal thoughts 0 0 -  PHQ-9 Score 2 2 -  Difficult doing work/chores Not difficult at all Not difficult at all -   Fall Risk: Fall Risk  07/27/2018 04/20/2018 09/27/2015  Falls in the past year? No No No   Assessment & Plan  1. Acquired hypothyroidism - levothyroxine (SYNTHROID, LEVOTHROID) 50 MCG tablet; Take 1 tablet (50 mcg total) by mouth  daily before breakfast.  Dispense: 5 tablet; Refill: 0 - TSH  2. Hypertensive chronic kidney disease, unspecified CKD stage - Continue current medication regimen; discussed DASH diet in detail. - Continue to monitor BP at home.  3. Pure hypercholesterolemia - Discussed removing bacon, saturated fast, and pork rinds from diet; referral to nutrition for further assistance. - Amb ref to Medical Nutrition Therapy-MNT  4. Morbid obesity due to excess calories (HCC) - Discussed diagnosis based on risk factors - HTN and HLD. - Discussed importance of 150 minutes of physical activity weekly, eat two servings of fish weekly, eat one serving of tree nuts ( cashews, pistachios, pecans, almonds.Marland Kitchen.) every other day, eat 6 servings of fruit/vegetables daily and drink plenty of water and avoid sweet beverages.  - Pt set goal of increasing walking time to 30 minutes 3 times a day and to attend nutrition classes. - Amb ref to Medical Nutrition Therapy-MNT  5. BMI 35.0-35.9,adult - Amb ref to Medical Nutrition Therapy-MNT  6. IFG (impaired fasting glucose) - See above regarding health teaching. Advised recent labs were WNL for BG, however we will need to monitor periodically. Discussed avoiding carbohydrates in her diet, along with the above dietary teaching. - Amb ref to Medical Nutrition Therapy-[facility]

## 2018-07-29 ENCOUNTER — Other Ambulatory Visit: Payer: Self-pay | Admitting: Family Medicine

## 2018-07-29 DIAGNOSIS — E039 Hypothyroidism, unspecified: Secondary | ICD-10-CM

## 2018-07-29 LAB — TSH: TSH: 2.2 u[IU]/mL (ref 0.450–4.500)

## 2018-07-29 MED ORDER — LEVOTHYROXINE SODIUM 50 MCG PO TABS: 50.0000 ug | ORAL_TABLET | Freq: Every day | ORAL | 0 refills | Status: DC

## 2018-08-22 ENCOUNTER — Telehealth: Payer: Self-pay | Admitting: Family Medicine

## 2018-08-22 NOTE — Telephone Encounter (Signed)
Copied from CRM [id number]. Topic: Quick Communication - Rx Refill/Question >> Aug 22, 2018 10:19 AM Oneal Grout wrote: Medication: clotrimazole-betamethasone (LOTRISONE) cream  Has the patient contacted their pharmacy? Yes.   (Agent: If no, request that the patient contact the pharmacy for the refill.) (Agent: If yes, when and what did the pharmacy advise?)  Preferred Pharmacy (with phone number or street name): [facility] [address]  Agent: Please be advised that RX refills may take up to 3 business days. We ask that you follow-up with your pharmacy.

## 2018-08-23 NOTE — Telephone Encounter (Signed)
Please schedule patient appointment to see Irving Burton for medication refill

## 2018-08-23 NOTE — Telephone Encounter (Signed)
This medication was prescribed by her previous PCP, and we did not discuss at her visit with me.  I see that Dr. Laural Benes diagnosed her with Tinea Corporis (fungal infection of the skin), however I need to see her in the office to see how she is doing on the medication prior to refilling.  Please set up a visit for her to see me.

## 2018-08-23 NOTE — Telephone Encounter (Signed)
Started this medication on 8/1. Patient would like refill

## 2018-08-24 NOTE — Telephone Encounter (Signed)
Spoke with pt. She stated that she will try something OTC and if that does not work then she will make an appt. Stated that its hard coming in with her new work schedule. She also stated that the rash is almost gone that she just needed one more round of medication

## 2018-09-06 ENCOUNTER — Ambulatory Visit: Payer: Managed Care, Other (non HMO) | Admitting: Family Medicine

## 2018-09-06 ENCOUNTER — Encounter: Payer: Self-pay | Admitting: Family Medicine

## 2018-09-06 VITALS — BP 130/80 | HR 66 | Temp 98.5°F | Resp 16 | Ht 62.0 in | Wt 200.0 lb

## 2018-09-06 DIAGNOSIS — R21 Rash and other nonspecific skin eruption: Secondary | ICD-10-CM

## 2018-09-06 MED ORDER — TRIAMCINOLONE ACETONIDE 0.1 % EX CREA: 1.0000 "application " | TOPICAL_CREAM | Freq: Two times a day (BID) | CUTANEOUS | 0 refills | Status: DC

## 2018-09-06 NOTE — Patient Instructions (Signed)
Pityriasis Rosea Pityriasis rosea is a rash that usually appears on the trunk of the body. It may also appear on the upper arms and upper legs. It usually begins as a single patch, and then more patches begin to develop. The rash may cause mild itching, but it normally does not cause other problems. It usually goes away without treatment. However, it may take weeks or months for the rash to go away completely. What are the causes? The cause of this condition is not known. The condition does not spread from person to person (is noncontagious). What increases the risk? This condition is more likely to develop in young adults and children. It is most common in the spring and fall. What are the signs or symptoms? The main symptom of this condition is a rash.  The rash usually begins with a single oval patch that is larger than the ones that follow. This is called a herald patch. It generally appears a week or more before the rest of the rash appears.  When more patches start to develop, they spread quickly on the trunk, back, and arms. These patches are smaller than the first one.  The patches that make up the rash are usually oval-shaped and pink or red in color. They are usually flat, but they may sometimes be raised so that they can be felt with a finger. They may also be finely crinkled and have a scaly ring around the edge.  The rash does not typically appear on areas of the skin that are exposed to the sun.  Most people who have this condition do not have other symptoms, but some have mild itching. In a few cases, a mild headache or body aches may occur before the rash appears and then go away. How is this diagnosed? Your health care provider may diagnose this condition by doing a physical exam and taking your medical history. To rule out other possible causes for the rash, the health care provider may order blood tests or take a skin sample from the rash to be looked at under a microscope. How  is this treated? Usually, treatment is not needed for this condition. The rash will probably go away on its own in 4-8 weeks. In some cases, a health care provider may recommend or prescribe medicine to reduce itching. Follow these instructions at home:  Take medicines only as directed by your health care provider.  Avoid scratching the affected areas of skin.  Do not take hot baths or use a sauna. Use only warm water when bathing or showering. Heat can increase itching. Contact a health care provider if:  Your rash does not go away in 8 weeks.  Your rash gets much worse.  You have a fever.  You have swelling or pain in the rash area.  You have fluid, blood, or pus coming from the rash area. This information is not intended to replace advice given to you by your health care provider. Make sure you discuss any questions you have with your health care provider. Document Released: 01/06/2002 Document Revised: 05/07/2016 Document Reviewed: 11/07/2014 Elsevier Interactive Patient Education  2018 Elsevier Inc.  

## 2018-09-06 NOTE — Progress Notes (Signed)
Name: Sherry Patel   MRN: [id number]    DOB: 1966-05-20   Date:09/06/2018       Progress Note  Subjective  Chief Complaint  Chief Complaint  Patient presents with  . Allergic Reaction    HPI  Pt presents with concern for rash for about 3 months - was using a 50+ vitamin and switched to ally energy - about a week later a rash started on BLE and lower abdomen.  Initially the rash was red and scaling, was put on a steroid cream by another provider and it seems to be improving ans she stopped having new outbreaks and the rash has been slowly healing.  Denies itching or pain.  She has since stopped taking her vitamins.  May have changed her laundry detergent during the process of having this rash, but she is unsure.  She has not changed any other topical products in her routine right now.  Rash is currently on trunk and BLE  Patient Active Problem List   Diagnosis Date Noted  . BMI 35.0-35.9,adult 07/27/2018  . Acquired hypothyroidism 12/31/2015  . Hyperlipidemia 09/25/2015  . IFG (impaired fasting glucose) 09/25/2015  . Hypertensive CKD (chronic kidney disease) 09/25/2015  . Morbid obesity due to excess calories (HCC) 09/25/2015  . Menopause 09/25/2015    Social History   Tobacco Use  . Smoking status: Former Smoker    Packs/day: 1.00    Years: 20.00    Pack years: 20.00  . Smokeless tobacco: Never Used  . Tobacco comment: Quit around 1999  Substance Use Topics  . Alcohol use: No     Current Outpatient Medications:  .  amLODipine (NORVASC) 5 MG tablet, Take 1 tablet (5 mg total) by mouth daily., Disp: 90 tablet, Rfl: 3 .  atorvastatin (LIPITOR) 10 MG tablet, Take 1 tablet (10 mg total) by mouth daily at 6 PM., Disp: 90 tablet, Rfl: 1 .  clotrimazole-betamethasone (LOTRISONE) cream, APPLY  CREAM TOPICALLY TO AFFECTED AREA TWICE DAILY, Disp: 45 g, Rfl: 1 .  levothyroxine (SYNTHROID, LEVOTHROID) 50 MCG tablet, Take 1 tablet (50 mcg total) by mouth daily before breakfast.,  Disp: 90 tablet, Rfl: 0 .  metoprolol succinate (TOPROL-XL) 100 MG 24 hr tablet, Take 1 tablet (100 mg total) by mouth daily. Take with or immediately following a meal., Disp: 90 tablet, Rfl: 1  No Known Allergies  I personally reviewed active problem list, medication list, allergies with the patient/caregiver today.  ROS  Constitutional: Negative for fever or weight change.  Respiratory: Negative for cough and shortness of breath.   Cardiovascular: Negative for chest pain or palpitations.  Gastrointestinal: Negative for abdominal pain, no bowel changes.  Musculoskeletal: Negative for gait problem or joint swelling.  Skin: Positive for rash.  Neurological: Negative for dizziness or headache.  No other specific complaints in a complete review of systems (except as listed in HPI above).  Objective  Vitals:   09/06/18 0927  BP: 130/80  Pulse: 66  Resp: 16  Temp: 98.5 F (36.9 C)  TempSrc: Oral  SpO2: 99%  Weight: 200 lb (90.7 kg)  Height: 5\' 2"  (1.575 m)   Body mass index is 36.58 kg/m.  Nursing Note and Vital Signs reviewed.  Physical Exam  Constitutional: Patient appears well-developed and well-nourished. No distress.  HENT: Head: Normocephalic and atraumatic.  Cardiovascular: Normal rate, regular rhythm and normal heart sounds.  No murmur heard. No BLE edema. Pulmonary/Chest: Effort normal and breath sounds normal. No respiratory distress. Musculoskeletal: Normal range  of motion, no joint effusions. No gross deformities Neurological: Pt is alert and oriented to person, place, and time. No cranial nerve deficit. Coordination, balance, strength, speech and gait are normal.  Skin: Hyperpigmented slightly scaly discreet lesions on the trunk and BLE.  No notable herald patch, difficult to ascertain if in christmas tree pattern as the rash appears to be in the latter stages of healing. Psychiatric: Patient has a normal mood and affect. behavior is normal. Judgment and thought  content normal.  No results found for this or any previous visit (from the past 72 hour(s)).  Assessment & Plan  1. Rash and nonspecific skin eruption - Suspect pityriasis rosea, however it is difficult to ascertain if pityriasis rosea as it appears to be in the later stages or healing.  I advised to monitor for changes, secondary infection, and any worsening, if not improving in 2 weeks, we will refer to dermatology. - triamcinolone cream (KENALOG) 0.1 %; Apply 1 application topically 2 (two) times daily. Too strong for groin, underarms, face, and breasts  Dispense: 30 g; Refill: 0

## 2018-09-12 ENCOUNTER — Other Ambulatory Visit: Payer: Self-pay | Admitting: Family Medicine

## 2018-09-18 ENCOUNTER — Other Ambulatory Visit: Payer: Self-pay | Admitting: Family Medicine

## 2018-09-18 DIAGNOSIS — E039 Hypothyroidism, unspecified: Secondary | ICD-10-CM

## 2018-10-05 ENCOUNTER — Other Ambulatory Visit: Payer: Self-pay | Admitting: Family Medicine

## 2018-10-05 DIAGNOSIS — Z1231 Encounter for screening mammogram for malignant neoplasm of breast: Secondary | ICD-10-CM

## 2018-10-25 ENCOUNTER — Ambulatory Visit: Payer: Managed Care, Other (non HMO) | Admitting: Family Medicine

## 2018-10-27 ENCOUNTER — Ambulatory Visit: Payer: Managed Care, Other (non HMO) | Admitting: Family Medicine

## 2018-11-07 ENCOUNTER — Ambulatory Visit
Admission: RE | Admit: 2018-11-07 | Discharge: 2018-11-07 | Disposition: A | Discharge status: Home / Self Care | Payer: Managed Care, Other (non HMO) | Source: Ambulatory Visit | Attending: Family Medicine | Admitting: Family Medicine

## 2018-11-07 DIAGNOSIS — Z1231 Encounter for screening mammogram for malignant neoplasm of breast: Secondary | ICD-10-CM | POA: Diagnosis not present

## 2018-11-17 ENCOUNTER — Encounter: Payer: Self-pay | Admitting: Family Medicine

## 2018-11-17 ENCOUNTER — Ambulatory Visit (INDEPENDENT_AMBULATORY_CARE_PROVIDER_SITE_OTHER): Payer: Managed Care, Other (non HMO) | Admitting: Family Medicine

## 2018-11-17 VITALS — BP 134/86 | HR 82 | Temp 97.8°F | Resp 16 | Ht 62.0 in | Wt 193.1 lb

## 2018-11-17 DIAGNOSIS — Z6835 Body mass index (BMI) 35.0-35.9, adult: Secondary | ICD-10-CM | POA: Diagnosis not present

## 2018-11-17 DIAGNOSIS — E039 Hypothyroidism, unspecified: Secondary | ICD-10-CM | POA: Diagnosis not present

## 2018-11-17 DIAGNOSIS — Z23 Encounter for immunization: Secondary | ICD-10-CM

## 2018-11-17 DIAGNOSIS — Z1212 Encounter for screening for malignant neoplasm of rectum: Secondary | ICD-10-CM

## 2018-11-17 DIAGNOSIS — I129 Hypertensive chronic kidney disease with stage 1 through stage 4 chronic kidney disease, or unspecified chronic kidney disease: Secondary | ICD-10-CM | POA: Diagnosis not present

## 2018-11-17 DIAGNOSIS — Z1211 Encounter for screening for malignant neoplasm of colon: Secondary | ICD-10-CM

## 2018-11-17 DIAGNOSIS — E7800 Pure hypercholesterolemia, unspecified: Secondary | ICD-10-CM

## 2018-11-17 DIAGNOSIS — R7301 Impaired fasting glucose: Secondary | ICD-10-CM

## 2018-11-17 MED ORDER — LEVOTHYROXINE SODIUM 50 MCG PO TABS: 50.0000 ug | ORAL_TABLET | Freq: Every day | ORAL | 0 refills | Status: DC

## 2018-11-17 MED ORDER — AMLODIPINE BESYLATE 5 MG PO TABS: 5.0000 mg | ORAL_TABLET | Freq: Every day | ORAL | 3 refills | Status: DC

## 2018-11-17 MED ORDER — METOPROLOL SUCCINATE ER 100 MG PO TB24: 100.0000 mg | ORAL_TABLET | Freq: Every day | ORAL | 1 refills | Status: DC

## 2018-11-17 MED ORDER — ATORVASTATIN CALCIUM 10 MG PO TABS: 10.0000 mg | ORAL_TABLET | Freq: Every day | ORAL | 1 refills | Status: DC

## 2018-11-17 NOTE — Progress Notes (Addendum)
Name: Sherry Patel   MRN: [id number]    DOB: [birth year]   Date:11/17/2018       Progress Note  Subjective  Chief Complaint  Chief Complaint  Patient presents with  . Medication Refill  . Hypertension    Denies any symptoms  . Hyperlipidemia  . Hypothyroidism    HPI  Social: She works as accounts payable for [employer];   Hypothyroidism: In may 2019 she had elevated TSH because she had stopped taking her synthroid; she has been back on synthroid since then, and last level was normal.   She has been on synthroid since 2016 - has not had [place], no history of thyroid nodules, no history of hyperthyroidism, no family history of thyroid cancer or thyroid issues.  Has had palpitations intermittently for many years - they occur when she's stressed - taking metoprolol and this seems to work well for her. Denies hair/skin/nail changes, denies heat/cold intolerance, no constipation.  HTN:  BP is at goal today; CMP from 04/20/2018 is normal - we will recheck today.  She denies headaches, dizziness, chest pain, shortness of breath.  Does endorse occasional Bilateral foot swelling - goes away with elevation. Does not avoid salt in the diet at this time. Stable and unchanged from last visit.  CKD: Had elevated microalbumin in May 2019; kidney function appears to have been stable since 2016 (this is as far back as the records show).  Most recent CMP was 04/20/2018 and was normal - we will recheck today.  She is not on ACE-I due to angioedema in the past.  Hyperlipidemia: Current Medication Regimen: Lipitor 10mg  for 2-3 years.   - Last check was May 2019 and showed slightly elevated LDL. - Current Diet: Tries to avoid fried foods as much as possible - Denies: Chest pain, shortness of breath, myalgias. - Documented aortic atherosclerosis? No - Risk factors for atherosclerosis: hypercholesterolemia and hypertension - Stable, unchanged since last visit.  Obesity: Body mass index is 35.32 kg/m. -  Down 7lbs since last visit Diet: Breakfast - boiled eggs, [place] sausage, cheese; Lunch - baked chicken, potatoes, green beans or salad (2x/week), snack at [employer]	[place] - skips Pork rinds, stouffer's sugar free chocolate, may get a candy bar every now and then.  Drinks diet drinks or water. Still eating similar diet, but stopping meals after 3pm Exercise: moderately active - walks on her break 3 times a week for about 15 minutes. Co-Morbid Conditions: dyslipidemias and hypertension; 2 or more of these conditions combined with BMI >30 is considered morbid obesity; is this diagnosis appropriate and/or added to patient's problem list? Yes   Impaired Fasting Glucose: Does not check BG's at home; last CMP BG was 82; denies polyphagia, polyuria, or polydipsia. We will check A1C today.  Colorectal Cancer:  Denies family or personal history of colorectal cancer, no changes in BM's - no blood in stool, dark and tarry stool, mucus in stool, or constipation/diarrhea.  Patient Active Problem List   Diagnosis Date Noted  . BMI 35.0-35.9,adult 07/27/2018  . Acquired hypothyroidism 12/31/2015  . Hyperlipidemia 09/25/2015  . IFG (impaired fasting glucose) 09/25/2015  . Hypertensive CKD (chronic kidney disease) 09/25/2015  . Morbid obesity due to excess calories (HCC) 09/25/2015  . Menopause 09/25/2015    Past Surgical History:  Procedure Laterality Date  . breast biopy Left 20 years ago    Family History  Problem Relation Age of Onset  . Hypertension Mother   . Hypertension Maternal Grandmother   .  Hypertension Maternal Grandfather     Social History   Socioeconomic History  . Marital status: Married    Spouse name: Not on file  . Number of children: 4  . Years of education: Not on file  . Highest education level: High school graduate  Occupational History  . Occupation: Educational psychologistAccounts payable    Employer: [employer]  Social Needs  . Financial resource strain: Not hard at all  . Food insecurity:      Worry: Never true    Inability: Never true  . Transportation needs:    Medical: No    Non-medical: No  Tobacco Use  . Smoking status: Former Smoker    Packs/day: 1.00    Years: 20.00    Pack years: 20.00  . Smokeless tobacco: Never Used  . Tobacco comment: Quit around 1999  Substance and Sexual Activity  . Alcohol use: No  . Drug use: No  . Sexual activity: Yes    Partners: Male  Lifestyle  . Physical activity:    Days per week: 3 days    Minutes per session: 30 min  . Stress: Not at all  Relationships  . Social connections:    Talks on phone: More than three times a week    Gets together: More than three times a week    Attends religious service: More than 4 times per year    Active member of club or organization: Yes    Attends meetings of clubs or organizations: More than 4 times per year    Relationship status: Married  . Intimate partner violence:    Fear of current or ex partner: No    Emotionally abused: No    Physically abused: No    Forced sexual activity: No  Other Topics Concern  . Not on file  Social History Narrative  . Not on file     Current Outpatient Medications:  .  amLODipine (NORVASC) 5 MG tablet, Take 1 tablet (5 mg total) by mouth daily., Disp: 90 tablet, Rfl: 3 .  atorvastatin (LIPITOR) 10 MG tablet, Take 1 tablet (10 mg total) by mouth daily at 6 PM., Disp: 90 tablet, Rfl: 1 .  levothyroxine (SYNTHROID, LEVOTHROID) 50 MCG tablet, Take 1 tablet (50 mcg total) by mouth daily before breakfast., Disp: 90 tablet, Rfl: 0 .  metoprolol succinate (TOPROL-XL) 100 MG 24 hr tablet, Take 1 tablet (100 mg total) by mouth daily. Take with or immediately following a meal., Disp: 90 tablet, Rfl: 1 .  triamcinolone cream (KENALOG) 0.1 %, Apply 1 application topically 2 (two) times daily. Too strong for groin, underarms, face, and breasts, Disp: 30 g, Rfl: 0 .  clotrimazole-betamethasone (LOTRISONE) cream, APPLY  CREAM TOPICALLY TO AFFECTED AREA TWICE DAILY  (Patient not taking: Reported on 11/17/2018), Disp: 45 g, Rfl: 1  No Known Allergies  I personally reviewed active problem list, medication list, allergies, notes from last encounter, lab results with the patient/caregiver today.   ROS  Constitutional: Negative for fever or weight change.  Respiratory: Negative for cough and shortness of breath.   Cardiovascular: Negative for chest pain or palpitations.  Gastrointestinal: Negative for abdominal pain, no bowel changes.  Musculoskeletal: Negative for gait problem or joint swelling.  Skin: Negative for rash.  Neurological: Negative for dizziness or headache.  No other specific complaints in a complete review of systems (except as listed in HPI above).  Objective  Vitals:   11/17/18 0915  BP: 134/86  Pulse: 82  Resp: 16  Temp: 97.8 F (36.6 C)  TempSrc: Oral  SpO2: 97%  Weight: 193 lb 1.6 oz (87.6 kg)  Height: 5\' 2"  (1.575 m)   Body mass index is 35.32 kg/m.  Physical Exam  Constitutional: Patient appears well-developed and well-nourished. No distress.  HENT: Head: Normocephalic and atraumatic. Nose: Nose normal. Mouth/Throat: Oropharynx is clear and moist. No oropharyngeal exudate or tonsillar swelling.  Eyes: Conjunctivae and EOM are normal. No scleral icterus.  Neck: Normal range of motion. Neck supple. No JVD present. No thyromegaly present.  Cardiovascular: Normal rate, regular rhythm and normal heart sounds.  No murmur heard. No BLE edema. Pulmonary/Chest: Effort normal and breath sounds normal. No respiratory distress. Musculoskeletal: Normal range of motion, no joint effusions. No gross deformities Neurological: Pt is alert and oriented to person, place, and time. No cranial nerve deficit. Coordination, balance, strength, speech and gait are normal.  Skin: Skin is warm and dry. No rash noted. No erythema.  Psychiatric: Patient has a normal mood and affect. behavior is normal. Judgment and thought content normal.  No  results found for this or any previous visit (from the past 72 hour(s)).  PHQ2/9: Depression screen [facility] 2/9 11/17/2018 09/06/2018 07/27/2018 04/20/2018 09/27/2015  Decreased Interest 0 0 0 0 0  Down, Depressed, Hopeless 0 0 0 0 0  PHQ - 2 Score 0 0 0 0 0  Altered sleeping - 0 0 1 -  Tired, decreased energy - 0 1 1 -  Change in appetite - 0 0 0 -  Feeling bad or failure about yourself  - 0 0 0 -  Trouble concentrating - 1 1 0 -  Moving slowly or fidgety/restless - 0 0 0 -  Suicidal thoughts - 0 0 0 -  PHQ-9 Score - 1 2 2  -  Difficult doing work/chores - Not difficult at all Not difficult at all Not difficult at all -   Fall Risk: Fall Risk  11/17/2018 09/06/2018 07/27/2018 04/20/2018 09/27/2015  Falls in the past year? 0 No No No No  Number falls in past yr: 0 - - - -  Injury with Fall? 0 - - - -   Functional Status Survey: Is the patient deaf or have difficulty hearing?: No Does the patient have difficulty seeing, even when wearing glasses/contacts?: Yes(glasses) Does the patient have difficulty concentrating, remembering, or making decisions?: No Does the patient have difficulty walking or climbing stairs?: No Does the patient have difficulty dressing or bathing?: No Does the patient have difficulty doing errands alone such as visiting a doctor's office or shopping?: No   Assessment & Plan  1. Acquired hypothyroidism - Will provide refill after results are back - TSH  2. Hypertensive chronic kidney disease, unspecified CKD stage - amLODipine (NORVASC) 5 MG tablet; Take 1 tablet (5 mg total) by mouth daily.  Dispense: 90 tablet; Refill: 3 - metoprolol succinate (TOPROL-XL) 100 MG 24 hr tablet; Take 1 tablet (100 mg total) by mouth daily. Take with or immediately following a meal.  Dispense: 90 tablet; Refill: 1 - Comprehensive metabolic panel  3. BMI 35.0-35.9,adult - Hemoglobin A1c  4. Pure hypercholesterolemia - atorvastatin (LIPITOR) 10 MG tablet; Take 1 tablet (10 mg total)  by mouth daily at 6 PM.  Dispense: 90 tablet; Refill: 1  5. Morbid obesity due to excess calories (HCC) - Losing weight with lifestyle management.  - Discussed importance of 150 minutes of physical activity weekly, eat two servings of fish weekly, eat one serving of tree nuts ( cashews,  pistachios, pecans, almonds.Marland Kitchen) every other day, eat 6 servings of fruit/vegetables daily and drink plenty of water and avoid sweet beverages.   6. IFG (impaired fasting glucose) - Hemoglobin A1c  7. Need for immunization against influenza - Flu Vaccine QUAD 6+ mos PF IM (Fluarix Quad PF)  8. Colorectal cancer screening - Cologuard  -Reviewed Health Maintenance: Cologuard ordered

## 2018-11-18 ENCOUNTER — Other Ambulatory Visit: Payer: Self-pay | Admitting: Family Medicine

## 2018-11-18 DIAGNOSIS — E039 Hypothyroidism, unspecified: Secondary | ICD-10-CM

## 2018-11-18 LAB — COMPREHENSIVE METABOLIC PANEL
ALT: 17 IU/L (ref 0–32)
AST: 16 IU/L (ref 0–40)
Albumin/Globulin Ratio: 1.3 (ref 1.2–2.2)
Albumin: 4.3 g/dL (ref 3.5–5.5)
Alkaline Phosphatase: 77 IU/L (ref 39–117)
BUN/Creatinine Ratio: 20 (ref 9–23)
BUN: 13 mg/dL (ref 6–24)
Bilirubin Total: 0.2 mg/dL (ref 0.0–1.2)
CALCIUM: 9.6 mg/dL (ref 8.7–10.2)
CO2: 23 mmol/L (ref 20–29)
CREATININE: 0.65 mg/dL (ref 0.57–1.00)
Chloride: 101 mmol/L (ref 96–106)
GFR calc Af Amer: 116 mL/min/{1.73_m2} (ref >59)
GFR, EST NON AFRICAN AMERICAN: 101 mL/min/{1.73_m2} (ref >59)
GLOBULIN, TOTAL: 3.2 g/dL (ref 1.5–4.5)
Glucose: 86 mg/dL (ref 65–99)
Potassium: 4.4 mmol/L (ref 3.5–5.2)
Sodium: 137 mmol/L (ref 134–144)
TOTAL PROTEIN: 7.5 g/dL (ref 6.0–8.5)

## 2018-11-18 LAB — TSH: TSH: 2.65 u[IU]/mL (ref 0.450–4.500)

## 2018-11-18 LAB — HEMOGLOBIN A1C
ESTIMATED AVERAGE GLUCOSE: 128 mg/dL
HEMOGLOBIN A1C: 6.1 % — AB (ref 4.8–5.6)

## 2018-11-18 MED ORDER — LEVOTHYROXINE SODIUM 50 MCG PO TABS: 50.0000 ug | ORAL_TABLET | Freq: Every day | ORAL | 1 refills | Status: DC

## 2019-01-17 ENCOUNTER — Telehealth: Payer: Self-pay | Admitting: Emergency Medicine

## 2019-01-17 NOTE — Telephone Encounter (Signed)
From [facility] (Cologuard) patient did not complete test.

## 2019-01-18 NOTE — Telephone Encounter (Signed)
Please call and remind patient.

## 2019-03-20 ENCOUNTER — Encounter: Payer: Managed Care, Other (non HMO) | Admitting: Family Medicine

## 2019-04-20 ENCOUNTER — Encounter: Payer: Managed Care, Other (non HMO) | Admitting: Family Medicine

## 2019-05-16 ENCOUNTER — Other Ambulatory Visit: Payer: Self-pay | Admitting: Family Medicine

## 2019-05-16 DIAGNOSIS — I129 Hypertensive chronic kidney disease with stage 1 through stage 4 chronic kidney disease, or unspecified chronic kidney disease: Secondary | ICD-10-CM

## 2019-05-16 DIAGNOSIS — E039 Hypothyroidism, unspecified: Secondary | ICD-10-CM

## 2019-05-16 DIAGNOSIS — E7800 Pure hypercholesterolemia, unspecified: Secondary | ICD-10-CM

## 2019-05-23 ENCOUNTER — Ambulatory Visit (INDEPENDENT_AMBULATORY_CARE_PROVIDER_SITE_OTHER): Payer: Managed Care, Other (non HMO) | Admitting: Family Medicine

## 2019-05-23 ENCOUNTER — Other Ambulatory Visit: Payer: Self-pay

## 2019-05-23 ENCOUNTER — Encounter: Payer: Self-pay | Admitting: Family Medicine

## 2019-05-23 VITALS — BP 122/70 | HR 65 | Temp 98.1°F | Resp 16 | Ht 62.0 in | Wt 196.1 lb

## 2019-05-23 DIAGNOSIS — Z124 Encounter for screening for malignant neoplasm of cervix: Secondary | ICD-10-CM

## 2019-05-23 DIAGNOSIS — R7303 Prediabetes: Secondary | ICD-10-CM

## 2019-05-23 DIAGNOSIS — Z1239 Encounter for other screening for malignant neoplasm of breast: Secondary | ICD-10-CM

## 2019-05-23 DIAGNOSIS — I129 Hypertensive chronic kidney disease with stage 1 through stage 4 chronic kidney disease, or unspecified chronic kidney disease: Secondary | ICD-10-CM

## 2019-05-23 DIAGNOSIS — E7800 Pure hypercholesterolemia, unspecified: Secondary | ICD-10-CM

## 2019-05-23 DIAGNOSIS — Z114 Encounter for screening for human immunodeficiency virus [HIV]: Secondary | ICD-10-CM

## 2019-05-23 DIAGNOSIS — R809 Proteinuria, unspecified: Secondary | ICD-10-CM

## 2019-05-23 DIAGNOSIS — Z6835 Body mass index (BMI) 35.0-35.9, adult: Secondary | ICD-10-CM

## 2019-05-23 DIAGNOSIS — E039 Hypothyroidism, unspecified: Secondary | ICD-10-CM | POA: Diagnosis not present

## 2019-05-23 DIAGNOSIS — Z1159 Encounter for screening for other viral diseases: Secondary | ICD-10-CM

## 2019-05-23 DIAGNOSIS — Z01419 Encounter for gynecological examination (general) (routine) without abnormal findings: Secondary | ICD-10-CM | POA: Diagnosis not present

## 2019-05-23 DIAGNOSIS — Z1212 Encounter for screening for malignant neoplasm of rectum: Secondary | ICD-10-CM

## 2019-05-23 DIAGNOSIS — Z1211 Encounter for screening for malignant neoplasm of colon: Secondary | ICD-10-CM

## 2019-05-23 DIAGNOSIS — R002 Palpitations: Secondary | ICD-10-CM

## 2019-05-23 NOTE — Progress Notes (Signed)
Name: Sherry Patel   MRN: [id number]    DOB: [birth year]   Date:05/23/2019       Progress Note  Subjective  Chief Complaint  Chief Complaint  Patient presents with  . Annual Exam    HPI  Patient presents for annual CPE and follow up:  Hypothyroidism: In may 2019 she had elevated TSH because she had stopped taking her synthroid; she has been back on synthroid 77mg since then, and last level was normal - due for recheck today. She has been on synthroid since 2016 - has not had [place] no history of thyroid nodules, no history of hyperthyroidism, no family history of thyroid cancer or thyroid issues. Has had palpitations intermittently for many years - they occur when she's stressed - taking metoprolol and this seems to work well for her. Denies hair/skin/nail changes, denies heat/cold intolerance, no constipation. Unchanged from last visit.  HTN: BP is at goal today; CMP from 04/20/2018 is normal - we will recheck today. She denies headaches, dizziness, chest pain, shortness of breath. Does endorse occasional Bilateral foot swelling - goes away with elevation. Does not avoid salt in the diet at this time. Stable and unchanged from last visit.  CKD: Had elevated microalbumin in May 2019; kidney function appears to have been stable since 2016 (this is as far back as the records show). Most recent CMP was 04/20/2018 and was normal - we will recheck today.  She is not on ACE-I due to angioedema in the past.  Hyperlipidemia: Current Medication Regimen:Lipitor 19mfor 2-3 years.  - Last check was May 2019 and showed slightly elevated LDL. - Current Diet:Tries to avoid fried foods as much as possible -Denies: Chest pain, shortness of breath, myalgias. - Documented aortic atherosclerosis?No - Risk factors for atherosclerosis:hypercholesterolemia and hypertension - Stable and unchanged; due for recheck today  Obesity: Body mass index is 35.87 kg/m. - Up 3lbs since last  visit. Diet:Struggling since working from home; has not been eating a lot, but eating what she wants Exercise:moderately active- walking about 3 times a week for about 15 minutes. Co-Morbid Conditions:dyslipidemias and hypertension; 2 or more of these conditions combined with BMI >30 is considered morbid obesity; is this diagnosis appropriate and/or added to patient's problem list?Yes  Prediabetes: Does not check BG's at home; last CMP BG was 82; denies polyphagia, polyuria, or polydipsia. We will check A1C today.   USPSTF grade A and B recommendations    [facility]  [facility]     Depression: Phq 9 is  negative [facility] 05/23/2019 11/17/2018 09/06/2018 07/27/2018 04/20/2018  Decreased Interest 0 0 0 0 0  Down, Depressed, Hopeless 0 0 0 0 0  PHQ - 2 Score 0 0 0 0 0  Altered sleeping 0 - 0 0 1  Tired, decreased energy 0 - 0 1 1  Change in appetite 0 - 0 0 0  Feeling bad or failure about yourself  0 - 0 0 0  Trouble concentrating 0 - 1 1 0  Moving slowly or fidgety/restless 0 - 0 0 0  Suicidal thoughts 0 - 0 0 0  PHQ-9 Score 0 - _0 Difficult doing work/chores Not difficult at all - Not difficult at all Not difficult at all Not difficult at all   Hypertension: BP Readings from Last 3 Encounters:  05/23/19 122/70  11/17/18 134/86  09/06/18 130/80   Obesity: Wt Readings from Last  3 Encounters:  05/23/19 196 lb 1.6 oz (89 kg)  11/17/18 193 lb 1.6 oz (87.6 kg)  09/06/18 200 lb (90.7 kg)   BMI Readings from Last 3 Encounters:  05/23/19 35.87 kg/m  11/17/18 35.32 kg/m  09/06/18 36.58 kg/m    Hep C Screening: We will check today STD testing and prevention (HIV/chl/gon/syphilis): HIV to be ordered; no new partners in the last year, declines additional STI screenings. Intimate partner violence:No concerns Sexual History/Pain during Intercourse:  No concerns Menstrual History/LMP/Abnormal Bleeding: She  is postmenopausal by about 2.5 years. Advised to notify our clinic if any vaginal bleeding occurs. Incontinence Symptoms: No concerns  Advanced Care Planning: A voluntary discussion about advance care planning including the explanation and discussion of advance directives.  Discussed health care proxy and Living will, and the patient was able to identify a health care proxy as Husband - Kaidan Spengler .  Patient does not have a living will at present time. If patient does have living will, I have requested they bring this to the clinic to be scanned in to their chart.  Breast cancer: No family history, did have LEFT breast cyst removed several years ago but this was benign. No results found for: HMMAMMO  BRCA gene screening: No family history Cervical cancer screening: Due for pap today; no history of abnormal paps in the past.   Osteoporosis Screening:  No results found for: HMDEXASCAN  Lipids:  Lab Results  Component Value Date   CHOL 165 04/20/2018   CHOL 194 10/19/2017   CHOL 191 05/25/2017   Lab Results  Component Value Date   HDL 44 04/20/2018   HDL 46 10/19/2017   HDL 46 05/25/2017   Lab Results  Component Value Date   LDLCALC 105 (H) 04/20/2018   LDLCALC 131 (H) 10/19/2017   LDLCALC 128 (H) 05/25/2017   Lab Results  Component Value Date   TRIG 82 04/20/2018   TRIG 86 10/19/2017   TRIG 83 05/25/2017   No results found for: CHOLHDL No results found for: LDLDIRECT  Glucose:  Glucose  Date Value Ref Range Status  11/17/2018 86 65 - 99 mg/dL Final  04/20/2018 82 65 - 99 mg/dL Final  10/19/2017 94 65 - 99 mg/dL Final    Skin cancer: No concerning lesions Colorectal cancer: Had cologuard ordered, missed the call and never received the kit - will reorder today.  Lung cancer:  Former smoker (quit 25 years ago) Low Dose CT Chest recommended if Age 55-80 years, 30 pack-year currently smoking OR have quit w/in 15years. Patient does not qualify.   ECG: Has history of  palpitations for many years, no changes to this today; denies chest pain or shortness of breath. Also has HTN.  No EKG on file - will perform today.  Patient Active Problem List   Diagnosis Date Noted  . BMI 35.0-35.9,adult 07/27/2018  . Acquired hypothyroidism 12/31/2015  . Hyperlipidemia 09/25/2015  . IFG (impaired fasting glucose) 09/25/2015  . Hypertensive CKD (chronic kidney disease) 09/25/2015  . Morbid obesity due to excess calories ([place]) 09/25/2015  . Menopause 09/25/2015    Past Surgical History:  Procedure Laterality Date  . breast biopy Left 20 years ago    Family History  Problem Relation Age of Onset  . Hypertension Mother   . Hypertension Maternal Grandmother   . Hypertension Maternal Grandfather     Social History   Socioeconomic History  . Marital status: Married    Spouse name: Not on file  .  Number of children: 4  . Years of education: Not on file  . Highest education level: High school graduate  Occupational History  . Occupation: Teacher, early years/pre: [place]  . Financial resource strain: Not hard at all  . Food insecurity:    Worry: Never true    Inability: Never true  . Transportation needs:    Medical: No    Non-medical: No  Tobacco Use  . Smoking status: Former Smoker    Packs/day: 1.00    Years: 20.00    Pack years: 20.00  . Smokeless tobacco: Never Used  . Tobacco comment: Quit around 1999  Substance and Sexual Activity  . Alcohol use: No  . Drug use: No  . Sexual activity: Yes    Partners: Male  Lifestyle  . Physical activity:    Days per week: 3 days    Minutes per session: 30 min  . Stress: Not at all  Relationships  . Social connections:    Talks on phone: More than three times a week    Gets together: More than three times a week    Attends religious service: More than 4 times per year    Active member of club or organization: Yes    Attends meetings of clubs or organizations: More than 4 times per  year    Relationship status: Married  . Intimate partner violence:    Fear of current or ex partner: No    Emotionally abused: No    Physically abused: No    Forced sexual activity: No  Other Topics Concern  . Not on file  Social History Narrative  . Not on file     Current Outpatient Medications:  .  amLODipine (NORVASC) 5 MG tablet, Take 1 tablet (5 mg total) by mouth daily., Disp: 90 tablet, Rfl: 3 .  Ascorbic Acid (VITAMIN C) 100 MG tablet, Take 100 mg by mouth daily., Disp: , Rfl:  .  aspirin 81 MG chewable tablet, Chew by mouth daily., Disp: , Rfl:  .  atorvastatin (LIPITOR) 10 MG tablet, TAKE 1 TABLET BY MOUTH  DAILY AT 6 PM., Disp: 90 tablet, Rfl: 0 .  levothyroxine (SYNTHROID) 50 MCG tablet, TAKE 1 TABLET BY MOUTH  DAILY BEFORE BREAKFAST, Disp: 90 tablet, Rfl: 0 .  metoprolol succinate (TOPROL-XL) 100 MG 24 hr tablet, TAKE 1 TABLET BY MOUTH  DAILY WITH OR IMMEDIATELY  FOLLOWING A MEAL, Disp: 90 tablet, Rfl: 0 .  Omega-3 Fatty Acids (FISH OIL) 1000 MG CAPS, Take by mouth., Disp: , Rfl:  .  thiamine (VITAMIN B-1) 100 MG tablet, Take 100 mg by mouth daily., Disp: , Rfl:  .  clotrimazole-betamethasone (LOTRISONE) cream, APPLY  CREAM TOPICALLY TO AFFECTED AREA TWICE DAILY (Patient not taking: Reported on 11/17/2018), Disp: 45 g, Rfl: 1 .  triamcinolone cream (KENALOG) 0.1 %, Apply 1 application topically 2 (two) times daily. Too strong for groin, underarms, face, and breasts (Patient not taking: Reported on 05/23/2019), Disp: 30 g, Rfl: 0  Allergies  Allergen Reactions  . Lisinopril Swelling    Angioedema in 2018     ROS  Constitutional: Negative for fever or weight change.  Respiratory: Negative for cough and shortness of breath.   Cardiovascular: Negative for chest pain or palpitations.  Gastrointestinal: Negative for abdominal pain, no bowel changes.  Musculoskeletal: Negative for gait problem or joint swelling.  Skin: Negative for rash.  Neurological: Negative for  dizziness or headache.  No other  specific complaints in a complete review of systems (except as listed in HPI above).  Objective  Vitals:   05/23/19 0927  BP: 122/70  Pulse: 65  Resp: 16  Temp: 98.1 F (36.7 C)  TempSrc: Oral  SpO2: 97%  Weight: 196 lb 1.6 oz (89 kg)  Height: 5' 2" (1.575 m)    Body mass index is 35.87 kg/m.  Physical Exam Constitutional: Patient appears well-developed and well-nourished. No distress.  HENT: Head: Normocephalic and atraumatic. Ears: B TMs ok, no erythema or effusion; Nose: Nose normal. Mouth/Throat: Oropharynx is clear and moist. No oropharyngeal exudate.  Eyes: Conjunctivae and EOM are normal. Pupils are equal, round, and reactive to light. No scleral icterus.  Neck: Normal range of motion. Neck supple. No JVD present. No thyromegaly present.  Cardiovascular: Normal rate, regular rhythm and normal heart sounds.  No murmur heard. No BLE edema. Pulmonary/Chest: Effort normal and breath sounds normal. No respiratory distress. Abdominal: Soft. Bowel sounds are normal, no distension. There is no tenderness. no masses Breast: no lumps or masses, no nipple discharge or rashes FEMALE GENITALIA:  External genitalia normal External urethra normal Vaginal vault normal without discharge or lesions Cervix normal without discharge or lesions Bimanual exam normal without masses Musculoskeletal: Normal range of motion, no joint effusions. No gross deformities Neurological: he is alert and oriented to person, place, and time. No cranial nerve deficit. Coordination, balance, strength, speech and gait are normal.  Skin: Skin is warm and dry. No rash noted. No erythema.  Psychiatric: Patient has a normal mood and affect. behavior is normal. Judgment and thought content normal.   No results found for this or any previous visit (from the past 2160 hour(s)).   PHQ2/9: Depression screen [facility] 2/9 05/23/2019 11/17/2018 09/06/2018 07/27/2018 04/20/2018  Decreased  Interest 0 0 0 0 0  Down, Depressed, Hopeless 0 0 0 0 0  PHQ - 2 Score 0 0 0 0 0  Altered sleeping 0 - 0 0 1  Tired, decreased energy 0 - 0 1 1  Change in appetite 0 - 0 0 0  Feeling bad or failure about yourself  0 - 0 0 0  Trouble concentrating 0 - 1 1 0  Moving slowly or fidgety/restless 0 - 0 0 0  Suicidal thoughts 0 - 0 0 0  PHQ-9 Score 0 - _0 Difficult doing work/chores Not difficult at all - Not difficult at all Not difficult at all Not difficult at all    Fall Risk: Fall Risk  05/23/2019 11/17/2018 09/06/2018 07/27/2018 04/20/2018  Falls in the past year? 0 0 No No No  Number falls in past yr: 0 0 - - -  Injury with Fall? 0 0 - - -  Follow up Falls evaluation completed - - - -    Assessment & Plan  1. Acquired hypothyroidism - Continue current dose of synthroid for now. - TSH  2. Hypertensive chronic kidney disease, unspecified CKD stage - Continue current medications, BP at goal today - Microalbumin / creatinine urine ratio - Comprehensive metabolic panel - EKG [id number]  3. Microalbuminuria - Microalbumin / creatinine urine ratio - Comprehensive metabolic panel  4. Pure hypercholesterolemia - Lipid panel  5. BMI 35.0-35.9,adult - Discussed importance of 150 minutes of physical activity weekly, eat two servings of fish weekly, eat one serving of tree nuts ( cashews, pistachios, pecans, almonds.Marland Kitchen) every other day, eat 6 servings of fruit/vegetables daily and drink plenty of water and avoid sweet beverages.  -  Hemoglobin A1c - Comprehensive metabolic panel  6. Morbid obesity due to excess calories ([place]) - See above regarding teaching  7. Prediabetes - Diabetic diet encouraged. - Hemoglobin A1c  8. Well woman exam -USPSTF grade A and B recommendations reviewed with patient; age-appropriate recommendations, preventive care, screening tests, etc discussed and encouraged; healthy living encouraged; see AVS for patient education given to patient -Discussed  importance of 150 minutes of physical activity weekly, eat two servings of fish weekly, eat one serving of tree nuts ( cashews, pistachios, pecans, almonds.Marland Kitchen) every other day, eat 6 servings of fruit/vegetables daily and drink plenty of water and avoid sweet beverages.  - Pap IG and HPV (high risk) DNA detection - MM 3D SCREEN BREAST BILATERAL; Future  9. Breast cancer screening - MM 3D SCREEN BREAST BILATERAL; Future  10. Need for hepatitis C screening test - Hepatitis C antibody  11. Encounter for screening for HIV - HIV Antibody (routine testing w rflx)  12. Cervical cancer screening - Pap IG and HPV (high risk) DNA detection  13. Encounter for colorectal cancer screening - Cologuard  14. Palpitations - EKG 12-Lead - Stable for many years on metoprolol, but no EKG is on file, will obtain today to have baseline.

## 2019-05-24 LAB — COMPREHENSIVE METABOLIC PANEL
ALT: 11 IU/L (ref 0–32)
AST: 14 IU/L (ref 0–40)
Albumin/Globulin Ratio: 1.4 (ref 1.2–2.2)
Albumin: 4.6 g/dL (ref 3.8–4.9)
Alkaline Phosphatase: 83 IU/L (ref 39–117)
BUN/Creatinine Ratio: 16 (ref 9–23)
BUN: 11 mg/dL (ref 6–24)
Bilirubin Total: 0.3 mg/dL (ref 0.0–1.2)
CO2: 23 mmol/L (ref 20–29)
Calcium: 10.2 mg/dL (ref 8.7–10.2)
Chloride: 100 mmol/L (ref 96–106)
Creatinine, Ser: 0.69 mg/dL (ref 0.57–1.00)
GFR calc Af Amer: 113 mL/min/{1.73_m2} (ref >59)
GFR calc non Af Amer: 98 mL/min/{1.73_m2} (ref >59)
Globulin, Total: 3.3 g/dL (ref 1.5–4.5)
Glucose: 85 mg/dL (ref 65–99)
Potassium: 4.6 mmol/L (ref 3.5–5.2)
Sodium: 140 mmol/L (ref 134–144)
Total Protein: 7.9 g/dL (ref 6.0–8.5)

## 2019-05-24 LAB — HEMOGLOBIN A1C
Est. average glucose Bld gHb Est-mCnc: 131 mg/dL
Hgb A1c MFr Bld: 6.2 % — ABNORMAL HIGH (ref 4.8–5.6)

## 2019-05-24 LAB — LIPID PANEL
Chol/HDL Ratio: 4.1 ratio (ref 0.0–4.4)
Cholesterol, Total: 199 mg/dL (ref 100–199)
HDL: 49 mg/dL (ref >39)
LDL Calculated: 132 mg/dL — ABNORMAL HIGH (ref 0–99)
Triglycerides: 91 mg/dL (ref 0–149)
VLDL Cholesterol Cal: 18 mg/dL (ref 5–40)

## 2019-05-24 LAB — HEPATITIS C ANTIBODY: Hep C Virus Ab: 0.1 s/co ratio (ref 0.0–0.9)

## 2019-05-24 LAB — TSH: TSH: 2.37 u[IU]/mL (ref 0.450–4.500)

## 2019-05-24 LAB — HIV ANTIBODY (ROUTINE TESTING W REFLEX): HIV Screen 4th Generation wRfx: NONREACTIVE

## 2019-05-24 LAB — MICROALBUMIN / CREATININE URINE RATIO
Creatinine, Urine: 37.5 mg/dL
Microalb/Creat Ratio: 19 mg/g creat (ref 0–29)
Microalbumin, Urine: 7.3 ug/mL

## 2019-06-01 LAB — PAP IG AND HPV HIGH-RISK: HPV, high-risk: NEGATIVE

## 2019-06-10 LAB — COLOGUARD: Cologuard: NEGATIVE

## 2019-08-12 ENCOUNTER — Other Ambulatory Visit: Payer: Self-pay | Admitting: Family Medicine

## 2019-08-12 DIAGNOSIS — E039 Hypothyroidism, unspecified: Secondary | ICD-10-CM

## 2019-09-27 ENCOUNTER — Other Ambulatory Visit: Payer: Self-pay | Admitting: Family Medicine

## 2019-09-27 DIAGNOSIS — E7800 Pure hypercholesterolemia, unspecified: Secondary | ICD-10-CM

## 2019-09-27 DIAGNOSIS — I129 Hypertensive chronic kidney disease with stage 1 through stage 4 chronic kidney disease, or unspecified chronic kidney disease: Secondary | ICD-10-CM

## 2019-09-27 NOTE — Telephone Encounter (Signed)
Not a current patient at [facility]. Patient is seen at [facility].

## 2019-09-27 NOTE — Telephone Encounter (Signed)
Requested medication (s) are due for refill today: yes  Requested medication (s) are on the active medication list: yes  Last refill:  08/09/2019  Future visit scheduled: yes  Notes to clinic:  Requesting 1 year supply   Requested Prescriptions  Pending Prescriptions Disp Refills   atorvastatin (LIPITOR) 10 MG tablet [Pharmacy Med Name: ATORVASTATIN  10MG   TAB] 90 tablet 3    Sig: TAKE 1 TABLET BY MOUTH  DAILY AT 6 PM.     Cardiovascular:  Antilipid - Statins Failed - 09/27/2019  6:45 AM      Failed - LDL in normal range and within 360 days    LDL Calculated  Date Value Ref Range Status  05/23/2019 132 (H) 0 - 99 mg/dL Final         Passed - Total Cholesterol in normal range and within 360 days    Cholesterol, Total  Date Value Ref Range Status  05/23/2019 199 100 - 199 mg/dL Final         Passed - HDL in normal range and within 360 days    HDL  Date Value Ref Range Status  05/23/2019 49 >39 mg/dL Final         Passed - Triglycerides in normal range and within 360 days    Triglycerides  Date Value Ref Range Status  05/23/2019 91 0 - 149 mg/dL Final         Passed - Patient is not pregnant      Passed - Valid encounter within last 12 months    Recent Outpatient Visits          4 months ago Well woman exam   [place], [place], FNP   10 months ago Acquired hypothyroidism   [place], Astrid Divine, FNP   1 year ago Rash and nonspecific skin eruption   [facility] [place], Astrid Divine, FNP   1 year ago Acquired hypothyroidism   [facility] [place], Astrid Divine, FNP   1 year ago Routine general medical examination at a health care facility   [facility], [place], DO      Future Appointments            In 1 month Hubbard Hartshorn, [facility], [facility]            metoprolol succinate (TOPROL-XL) 100 MG 24 hr tablet [Pharmacy Med Name:  METOPROLOL SUCC ER 100MG  TABLET] 90 tablet 3    Sig: TAKE 1 TABLET BY MOUTH  DAILY WITH OR IMMEDIATELY  FOLLOWING A MEAL     Cardiovascular:  Beta Blockers Passed - 09/27/2019  6:45 AM      Passed - Last BP in normal range    BP Readings from Last 1 Encounters:  05/23/19 122/70         Passed - Last Heart Rate in normal range    Pulse Readings from Last 1 Encounters:  05/23/19 65         Passed - Valid encounter within last 6 months    Recent Outpatient Visits          4 months ago Well woman exam   [facility] Hubbard Hartshorn, FNP   10 months ago Acquired hypothyroidism   [place], FNP   1 year ago Rash and nonspecific skin eruption   [facility] [place], [place]  E, FNP   1 year ago Acquired hypothyroidism   [facility] Doren Custard, FNP   1 year ago Routine general medical examination at a health care facility   [facility] [place], Oralia Rud, DO      Future Appointments            In 1 month Annye Asa, Gerome Apley, FNP [facility]

## 2019-11-22 ENCOUNTER — Ambulatory Visit: Payer: Managed Care, Other (non HMO) | Admitting: Family Medicine

## 2019-11-27 ENCOUNTER — Ambulatory Visit: Payer: Managed Care, Other (non HMO) | Admitting: Family Medicine

## 2019-12-01 ENCOUNTER — Other Ambulatory Visit: Payer: Self-pay | Admitting: Family Medicine

## 2019-12-01 DIAGNOSIS — I129 Hypertensive chronic kidney disease with stage 1 through stage 4 chronic kidney disease, or unspecified chronic kidney disease: Secondary | ICD-10-CM

## 2019-12-04 ENCOUNTER — Ambulatory Visit: Payer: Managed Care, Other (non HMO) | Admitting: Family Medicine

## 2019-12-04 ENCOUNTER — Encounter: Payer: Self-pay | Admitting: Family Medicine

## 2019-12-04 ENCOUNTER — Other Ambulatory Visit: Payer: Self-pay

## 2019-12-04 VITALS — BP 136/72 | HR 72 | Temp 97.5°F | Resp 18 | Ht 62.0 in | Wt 201.2 lb

## 2019-12-04 DIAGNOSIS — E039 Hypothyroidism, unspecified: Secondary | ICD-10-CM | POA: Diagnosis not present

## 2019-12-04 DIAGNOSIS — E7800 Pure hypercholesterolemia, unspecified: Secondary | ICD-10-CM | POA: Diagnosis not present

## 2019-12-04 DIAGNOSIS — L659 Nonscarring hair loss, unspecified: Secondary | ICD-10-CM

## 2019-12-04 DIAGNOSIS — R809 Proteinuria, unspecified: Secondary | ICD-10-CM | POA: Diagnosis not present

## 2019-12-04 DIAGNOSIS — R7303 Prediabetes: Secondary | ICD-10-CM | POA: Insufficient documentation

## 2019-12-04 DIAGNOSIS — I129 Hypertensive chronic kidney disease with stage 1 through stage 4 chronic kidney disease, or unspecified chronic kidney disease: Secondary | ICD-10-CM

## 2019-12-04 NOTE — Progress Notes (Signed)
Name: Sherry SeamenDaphne S Patel   MRN: [id number]    DOB: 1966/06/04   Date:12/04/2019       Progress Note  Subjective  Chief Complaint  Chief Complaint  Patient presents with  . Hypertension    6 month recheck  . Hyperlipidemia  . Hypothyroidism    HPI  Hypothyroidism: In may 2019 she had elevated TSH because she had stopped taking her synthroid; she has been back on synthroid 50mcg since then,and last level was normal - will recheck today. Has had palpitations intermittently for many years - they occur when she's stressed- taking metoprolol and this seems to work well for her. Denies skin/nail changes, denies heat/cold intolerance, no constipation. Notes hair has been thinning  HTN: BP is at goal today; CMP from 05/23/19 is normal- we will recheck today. She denies headaches, dizziness, chest pain, shortness of breath, BLE edema. Does endorse occasional Bilateral foot swelling - goes away with elevation.  She is trying to follow low sodium diet. Rx metoprolol, amlodipine - compliant with medication.  CKD: Had elevated microalbumin in May 2019; kidney function appears to have been stable since 2016 (this is as far back as the records show). Most recent CMP was June 2020 and was normal- we will recheck today. She is not on ACE-I due to angioedema in the past.  Hyperlipidemia: Current Medication Regimen:Lipitor 10mg  for 2-3 years now.  Tolerating well. - Last check was June 2020 and showed elevated LDL. - Current Diet:Tries to avoid fried foods as much as possible -Denies: Chest pain, shortness of breath, myalgias. - Documented aortic atherosclerosis?No-Risk factors for atherosclerosis:hypercholesterolemia and hypertension - Due for recheck today.  Obesity/Prediabetes: Body mass index is 36.8 kg/m. Diet:Baking chicken and broiling vegetables; still eating out sometimes.  Exercise:moderately active- walking about 3 times a week for about 15 minutes. Co-Morbid  Conditions:dyslipidemias and hypertension; 2 or more of these conditions combined with BMI >30 is considered morbid obesity; is this diagnosis appropriate and/or added to patient's problem list?Yes Prediabetes: Does not check BG's at home; denies polyphagia, polyuria, or polydipsia.We will check A1C today.   Patient Active Problem List   Diagnosis Date Noted  . Microalbuminuria 05/23/2019  . BMI 35.0-35.9,adult 07/27/2018  . Acquired hypothyroidism 12/31/2015  . Hyperlipidemia 09/25/2015  . IFG (impaired fasting glucose) 09/25/2015  . Hypertensive CKD (chronic kidney disease) 09/25/2015  . Morbid obesity due to excess calories (HCC) 09/25/2015  . Menopause 09/25/2015    Past Surgical History:  Procedure Laterality Date  . breast biopy Left 20 years ago    Family History  Problem Relation Age of Onset  . Hypertension Mother   . Hypertension Maternal Grandmother   . Hypertension Maternal Grandfather     Social History   Socioeconomic History  . Marital status: Married    Spouse name: Not on file  . Number of children: 4  . Years of education: Not on file  . Highest education level: High school graduate  Occupational History  . Occupation: Educational psychologistAccounts payable    Employer: [employer]  Tobacco Use  . Smoking status: Former Smoker    Packs/day: 1.00    Years: 20.00    Pack years: 20.00  . Smokeless tobacco: Never Used  . Tobacco comment: Quit around 1999  Substance and Sexual Activity  . Alcohol use: No  . Drug use: No  . Sexual activity: Yes    Partners: Male  Other Topics Concern  . Not on file  Social History Narrative  . Not on  file   Social Determinants of Health   Financial Resource Strain:   . Difficulty of Paying Living Expenses: Not on file  Food Insecurity:   . Worried About Programme researcher, broadcasting/film/video in the Last Year: Not on file  . Ran Out of Food in the Last Year: Not on file  Transportation Needs:   . Lack of Transportation (Medical): Not on file  . Lack  of Transportation (Non-Medical): Not on file  Physical Activity:   . Days of Exercise per Week: Not on file  . Minutes of Exercise per Session: Not on file  Stress:   . Feeling of Stress : Not on file  Social Connections:   . Frequency of Communication with Friends and Family: Not on file  . Frequency of Social Gatherings with Friends and Family: Not on file  . Attends Religious Services: Not on file  . Active Member of Clubs or Organizations: Not on file  . Attends Banker Meetings: Not on file  . Marital Status: Not on file  Intimate Partner Violence:   . Fear of Current or Ex-Partner: Not on file  . Emotionally Abused: Not on file  . Physically Abused: Not on file  . Sexually Abused: Not on file     Current Outpatient Medications:  .  amLODipine (NORVASC) 5 MG tablet, TAKE 1 TABLET BY MOUTH  DAILY, Disp: 90 tablet, Rfl: 0 .  Ascorbic Acid (VITAMIN C) 100 MG tablet, Take 100 mg by mouth daily., Disp: , Rfl:  .  aspirin 81 MG chewable tablet, Chew by mouth daily., Disp: , Rfl:  .  atorvastatin (LIPITOR) 10 MG tablet, TAKE 1 TABLET BY MOUTH  DAILY AT 6 PM., Disp: 90 tablet, Rfl: 3 .  levothyroxine (SYNTHROID) 50 MCG tablet, TAKE 1 TABLET BY MOUTH  DAILY BEFORE BREAKFAST, Disp: 90 tablet, Rfl: 3 .  metoprolol succinate (TOPROL-XL) 100 MG 24 hr tablet, TAKE 1 TABLET BY MOUTH  DAILY WITH OR IMMEDIATELY  FOLLOWING A MEAL, Disp: 90 tablet, Rfl: 3 .  Omega-3 Fatty Acids (FISH OIL) 1000 MG CAPS, Take by mouth., Disp: , Rfl:  .  thiamine (VITAMIN B-1) 100 MG tablet, Take 100 mg by mouth daily., Disp: , Rfl:   Allergies  Allergen Reactions  . Lisinopril Swelling    Angioedema in 2018    I personally reviewed active problem list, medication list, allergies, health maintenance, notes from last encounter, lab results with the patient/caregiver today.   ROS  Constitutional: Negative for fever or weight change.  Respiratory: Negative for cough and shortness of breath.     Cardiovascular: Negative for chest pain; occasional palpitations.  Gastrointestinal: Negative for abdominal pain, no bowel changes.  Musculoskeletal: Negative for gait problem or joint swelling.  Skin: Negative for rash.  Neurological: Negative for dizziness or headache.  No other specific complaints in a complete review of systems (except as listed in HPI above).  Objective  Vitals:   12/04/19 1029  BP: 136/72  Pulse: 72  Resp: 18  Temp: (!) 97.5 F (36.4 C)  TempSrc: Temporal  SpO2: 99%  Weight: 201 lb 3.2 oz (91.3 kg)  Height: 5\' 2"  (1.575 m)    Body mass index is 36.8 kg/m.  Physical Exam Constitutional: Patient appears well-developed and well-nourished. No distress.  HENT: Head: Normocephalic and atraumatic.  Eyes: Conjunctivae and EOM are normal. No scleral icterus.  Neck: Normal range of motion. Neck supple. No JVD present.  Cardiovascular: Normal rate, regular rhythm and normal heart  sounds.  No murmur heard. No BLE edema. Pulmonary/Chest: Effort normal and breath sounds normal. No respiratory distress. Musculoskeletal: Normal range of motion, no joint effusions. No gross deformities Neurological: Pt is alert and oriented to person, place, and time. No cranial nerve deficit. Coordination, balance, strength, speech and gait are normal.  Skin: Skin is warm and dry. No rash noted. No erythema.  Psychiatric: Patient has a normal mood and affect. behavior is normal. Judgment and thought content normal.  No results found for this or any previous visit (from the past 72 hour(s)).  PHQ2/9: Depression screen [facility] 2/9 12/04/2019 05/23/2019 11/17/2018 09/06/2018 07/27/2018  Decreased Interest 0 0 0 0 0  Down, Depressed, Hopeless 0 0 0 0 0  PHQ - 2 Score 0 0 0 0 0  Altered sleeping 0 0 - 0 0  Tired, decreased energy 0 0 - 0 1  Change in appetite 0 0 - 0 0  Feeling bad or failure about yourself  0 0 - 0 0  Trouble concentrating 0 0 - 1 1  Moving slowly or fidgety/restless 0 0  - 0 0  Suicidal thoughts 0 0 - 0 0  PHQ-9 Score 0 0 - 1 2  Difficult doing work/chores Not difficult at all Not difficult at all - Not difficult at all Not difficult at all   PHQ-2/9 Result is negative.    Fall Risk: Fall Risk  12/04/2019 05/23/2019 11/17/2018 09/06/2018 07/27/2018  Falls in the past year? 0 0 0 No No  Number falls in past yr: 0 0 0 - -  Injury with Fall? 0 0 0 - -  Follow up Falls evaluation completed Falls evaluation completed - - -   Assessment & Plan  1. Acquired hypothyroidism - TSH  2. Hypertensive chronic kidney disease, unspecified CKD stage - Maintain current regimen - COMPLETE METABOLIC PANEL WITH GFR  3. Microalbuminuria - COMPLETE METABOLIC PANEL WITH GFR  4. Pure hypercholesterolemia - Lipid panel  5. Morbid obesity due to excess calories ([place]) - Discussed importance of 150 minutes of physical activity weekly, eat two servings of fish weekly, eat one serving of tree nuts ( cashews, pistachios, pecans, almonds.Marland Kitchen) every other day, eat 6 servings of fruit/vegetables daily and drink plenty of water and avoid sweet beverages.  - COMPLETE METABOLIC PANEL WITH GFR - B12 and Folate Panel - VITAMIN D 25 Hydroxy (Vit-D Deficiency, Fractures)  6. Prediabetes - COMPLETE METABOLIC PANEL WITH GFR  7. Thinning hair - CBC with Differential/Platelet - TSH - COMPLETE METABOLIC PANEL WITH GFR - B12 and Folate Panel - VITAMIN D 25 Hydroxy (Vit-D Deficiency, Fractures)

## 2019-12-05 LAB — CBC WITH DIFFERENTIAL/PLATELET
Absolute Monocytes: 616 cells/uL (ref 200–950)
Basophils Absolute: 31 cells/uL (ref 0–200)
Basophils Relative: 0.4 %
Eosinophils Absolute: 172 cells/uL (ref 15–500)
Eosinophils Relative: 2.2 %
HCT: 40.6 % (ref 35.0–45.0)
Hemoglobin: 13 g/dL (ref 11.7–15.5)
Lymphs Abs: 2301 cells/uL (ref 850–3900)
MCH: 28 pg (ref 27.0–33.0)
MCHC: 32 g/dL (ref 32.0–36.0)
MCV: 87.3 fL (ref 80.0–100.0)
MPV: 9.9 fL (ref 7.5–12.5)
Monocytes Relative: 7.9 %
Neutro Abs: 4680 cells/uL (ref 1500–7800)
Neutrophils Relative %: 60 %
Platelets: 371 10*3/uL (ref 140–400)
RBC: 4.65 10*6/uL (ref 3.80–5.10)
RDW: 14.1 % (ref 11.0–15.0)
Total Lymphocyte: 29.5 %
WBC: 7.8 10*3/uL (ref 3.8–10.8)

## 2019-12-05 LAB — COMPLETE METABOLIC PANEL WITH GFR
AG Ratio: 1.3 (calc) (ref 1.0–2.5)
ALT: 18 U/L (ref 6–29)
AST: 17 U/L (ref 10–35)
Albumin: 4.3 g/dL (ref 3.6–5.1)
Alkaline phosphatase (APISO): 71 U/L (ref 37–153)
BUN: 13 mg/dL (ref 7–25)
CO2: 28 mmol/L (ref 20–32)
Calcium: 9.7 mg/dL (ref 8.6–10.4)
Chloride: 103 mmol/L (ref 98–110)
Creat: 0.69 mg/dL (ref 0.50–1.05)
GFR, Est African American: 114 mL/min/{1.73_m2} (ref >=60)
GFR, Est Non African American: 98 mL/min/{1.73_m2} (ref >=60)
Globulin: 3.3 g/dL (calc) (ref 1.9–3.7)
Glucose, Bld: 82 mg/dL (ref 65–99)
Potassium: 4.6 mmol/L (ref 3.5–5.3)
Sodium: 140 mmol/L (ref 135–146)
Total Bilirubin: 0.4 mg/dL (ref 0.2–1.2)
Total Protein: 7.6 g/dL (ref 6.1–8.1)

## 2019-12-05 LAB — LIPID PANEL
Cholesterol: 189 mg/dL (ref <200)
HDL: 43 mg/dL — ABNORMAL LOW (ref >=50)
LDL Cholesterol (Calc): 127 mg/dL (calc) — ABNORMAL HIGH
Non-HDL Cholesterol (Calc): 146 mg/dL (calc) — ABNORMAL HIGH (ref <130)
Total CHOL/HDL Ratio: 4.4 (calc) (ref <5.0)
Triglycerides: 88 mg/dL (ref <150)

## 2019-12-05 LAB — VITAMIN D 25 HYDROXY (VIT D DEFICIENCY, FRACTURES): Vit D, 25-Hydroxy: 25 ng/mL — ABNORMAL LOW (ref 30–100)

## 2019-12-05 LAB — B12 AND FOLATE PANEL
Folate: 20.8 ng/mL
Vitamin B-12: 860 pg/mL (ref 200–1100)

## 2019-12-05 LAB — TSH: TSH: 2.04 mIU/L

## 2019-12-06 ENCOUNTER — Other Ambulatory Visit: Payer: Self-pay | Admitting: Family Medicine

## 2019-12-06 DIAGNOSIS — E7800 Pure hypercholesterolemia, unspecified: Secondary | ICD-10-CM

## 2019-12-06 MED ORDER — ATORVASTATIN CALCIUM 20 MG PO TABS: 20.0000 mg | ORAL_TABLET | Freq: Every day | ORAL | 3 refills | Status: DC

## 2020-02-20 ENCOUNTER — Other Ambulatory Visit: Payer: Self-pay | Admitting: Family Medicine

## 2020-02-20 DIAGNOSIS — I129 Hypertensive chronic kidney disease with stage 1 through stage 4 chronic kidney disease, or unspecified chronic kidney disease: Secondary | ICD-10-CM

## 2020-02-20 NOTE — Telephone Encounter (Signed)
Requested Prescriptions  Pending Prescriptions Disp Refills  . amLODipine (NORVASC) 5 MG tablet [Pharmacy Med Name: AMLODIPINE  5MG   TAB] 90 tablet 0    Sig: TAKE 1 TABLET BY MOUTH  DAILY     Cardiovascular:  Calcium Channel Blockers Passed - 02/20/2020  9:40 PM      Passed - Last BP in normal range    BP Readings from Last 1 Encounters:  12/04/19 136/72         Passed - Valid encounter within last 6 months    Recent Outpatient Visits          2 months ago Acquired hypothyroidism   [facility], FNP   9 months ago Well woman exam   [facility], FNP   1 year ago Acquired hypothyroidism   [facility], FNP   1 year ago Rash and nonspecific skin eruption   [facility] [place], FNP   1 year ago Acquired hypothyroidism   [facility], FNP      Future Appointments            In 3 months Doren Custard, FNP [facility]

## 2020-03-14 ENCOUNTER — Ambulatory Visit: Payer: Managed Care, Other (non HMO)

## 2020-04-23 ENCOUNTER — Ambulatory Visit
Admission: RE | Admit: 2020-04-23 | Discharge: 2020-04-23 | Disposition: A | Discharge status: Home / Self Care | Payer: Managed Care, Other (non HMO) | Source: Ambulatory Visit | Attending: Family Medicine | Admitting: Family Medicine

## 2020-04-23 DIAGNOSIS — Z01419 Encounter for gynecological examination (general) (routine) without abnormal findings: Secondary | ICD-10-CM

## 2020-04-23 DIAGNOSIS — Z1231 Encounter for screening mammogram for malignant neoplasm of breast: Secondary | ICD-10-CM | POA: Diagnosis not present

## 2020-04-23 DIAGNOSIS — Z1239 Encounter for other screening for malignant neoplasm of breast: Secondary | ICD-10-CM

## 2020-06-03 ENCOUNTER — Ambulatory Visit: Payer: Managed Care, Other (non HMO) | Admitting: Family Medicine

## 2020-07-01 ENCOUNTER — Telehealth: Payer: Self-pay

## 2020-07-01 NOTE — Telephone Encounter (Signed)
Pt needs appt for refills

## 2020-07-01 NOTE — Telephone Encounter (Signed)
Tried to call pt to get her scheduled and the phone # on file does not match patients name

## 2020-07-12 ENCOUNTER — Ambulatory Visit (INDEPENDENT_AMBULATORY_CARE_PROVIDER_SITE_OTHER): Payer: Managed Care, Other (non HMO) | Admitting: Family Medicine

## 2020-07-12 ENCOUNTER — Encounter: Payer: Self-pay | Admitting: Family Medicine

## 2020-07-12 ENCOUNTER — Other Ambulatory Visit: Payer: Self-pay

## 2020-07-12 VITALS — BP 160/91 | HR 70 | Temp 97.3°F | Resp 16 | Ht 62.0 in | Wt 199.9 lb

## 2020-07-12 DIAGNOSIS — R809 Proteinuria, unspecified: Secondary | ICD-10-CM

## 2020-07-12 DIAGNOSIS — R7303 Prediabetes: Secondary | ICD-10-CM | POA: Diagnosis not present

## 2020-07-12 DIAGNOSIS — I129 Hypertensive chronic kidney disease with stage 1 through stage 4 chronic kidney disease, or unspecified chronic kidney disease: Secondary | ICD-10-CM

## 2020-07-12 DIAGNOSIS — R002 Palpitations: Secondary | ICD-10-CM

## 2020-07-12 DIAGNOSIS — E039 Hypothyroidism, unspecified: Secondary | ICD-10-CM

## 2020-07-12 DIAGNOSIS — N181 Chronic kidney disease, stage 1: Secondary | ICD-10-CM

## 2020-07-12 DIAGNOSIS — E785 Hyperlipidemia, unspecified: Secondary | ICD-10-CM

## 2020-07-12 LAB — POCT GLYCOSYLATED HEMOGLOBIN (HGB A1C): Hemoglobin A1C: 6.2 % — AB (ref 4.0–5.6)

## 2020-07-12 MED ORDER — ROSUVASTATIN CALCIUM 40 MG PO TABS: 40.0000 mg | ORAL_TABLET | Freq: Every day | ORAL | 1 refills | Status: DC

## 2020-07-12 MED ORDER — LEVOTHYROXINE SODIUM 50 MCG PO TABS: 50.0000 ug | ORAL_TABLET | Freq: Every day | ORAL | 1 refills | Status: DC

## 2020-07-12 MED ORDER — HYDROCHLOROTHIAZIDE 12.5 MG PO TABS: 12.5000 mg | ORAL_TABLET | Freq: Every day | ORAL | 0 refills | Status: DC

## 2020-07-12 MED ORDER — METFORMIN HCL ER 500 MG PO TB24: 500.0000 mg | ORAL_TABLET | Freq: Every day | ORAL | 1 refills | Status: DC

## 2020-07-12 MED ORDER — AMLODIPINE BESYLATE 5 MG PO TABS: 5.0000 mg | ORAL_TABLET | Freq: Every day | ORAL | 1 refills | Status: DC

## 2020-07-12 NOTE — Progress Notes (Signed)
Name: Sherry Patel   MRN: [id number]    DOB: [birth year]   Date:07/12/2020       Progress Note  Subjective  Chief Complaint  Chief Complaint  Patient presents with  . Hypothyroidism  . Obesity  . Prediabetes    HPI  Hypothyroidism: she has been taking levothyroxine 50 mcg daily, last TSH was normal Has had palpitations intermittently for many years - they occur when she's stressed, not associated with nausea, diaphoresis or chest pain - taking metoprolol and this seems to work well for her. Denies skin/nail changes, denies heat/cold intolerance, no constipation.Hair has been thinning out for years, no bald spots   HTN: BP is high today ; CMP , reviewed labs done 11/2019  She denies headaches, dizziness, chest pain, shortness of breath  She was stress at home this morning. Taking Amlodipine and Metoprolol. She has lower extremity edema but resolves with elevated, she cannot take ARB/ACE because of history of angioedema, we will add hctz, return in 2 weeks for bp check only CMA  CKD: Had elevated microalbumin in May 2019; kidney function appears to have been stable since 2016 (this is as far back as the records show). Most recent CMP was Dec 2020 and it was normal.   Hyperlipidemia: Last LDL was 127, she has been taking Atorvastatin 20 mg, explained goal is below at least 100, she is willing to switch to Rosuvastatin, but since she just opened bottle of Atorvastatin she will double the dose until out of medication and switch afterwards   Morbid Obesity/Prediabetes: BMI above 35 with co-morbidities such as HTN, dyslipidemia, pre-diabetes. She states since menopause she has been craving sweets, but cutting down on portion, making better food choices.    Patient Active Problem List   Diagnosis Date Noted  . Prediabetes 12/04/2019  . Pure hypercholesterolemia 12/04/2019  . Microalbuminuria 05/23/2019  . Acquired hypothyroidism 12/31/2015  . IFG (impaired fasting glucose)  09/25/2015  . Hypertensive chronic kidney disease 09/25/2015  . Morbid obesity due to excess calories (HCC) 09/25/2015  . Menopause 09/25/2015    Past Surgical History:  Procedure Laterality Date  . breast biopy Left 20 years ago  . BREAST EXCISIONAL BIOPSY      Family History  Problem Relation Age of Onset  . Hypertension Mother   . Hypertension Maternal Grandmother   . Hypertension Maternal Grandfather     Social History   Tobacco Use  . Smoking status: Former Smoker    Packs/day: 1.00    Years: 20.00    Pack years: 20.00  . Smokeless tobacco: Never Used  . Tobacco comment: Quit around 1999  Substance Use Topics  . Alcohol use: No     Current Outpatient Medications:  .  amLODipine (NORVASC) 5 MG tablet, Take 1 tablet (5 mg total) by mouth daily., Disp: 90 tablet, Rfl: 1 .  Ascorbic Acid (VITAMIN C) 100 MG tablet, Take 100 mg by mouth daily., Disp: , Rfl:  .  aspirin 81 MG chewable tablet, Chew by mouth daily., Disp: , Rfl:  .  levothyroxine (SYNTHROID) 50 MCG tablet, Take 1 tablet (50 mcg total) by mouth daily before breakfast., Disp: 90 tablet, Rfl: 1 .  metoprolol succinate (TOPROL-XL) 100 MG 24 hr tablet, TAKE 1 TABLET BY MOUTH  DAILY WITH OR IMMEDIATELY  FOLLOWING A MEAL, Disp: 90 tablet, Rfl: 3 .  Omega-3 Fatty Acids (FISH OIL) 1000 MG CAPS, Take by mouth., Disp: , Rfl:  .  thiamine (VITAMIN B-1) 100 MG tablet,  Take 100 mg by mouth daily., Disp: , Rfl:  .  hydrochlorothiazide (HYDRODIURIL) 12.5 MG tablet, Take 1 tablet (12.5 mg total) by mouth daily., Disp: 30 tablet, Rfl: 0 .  metFORMIN (GLUCOPHAGE-XR) 500 MG 24 hr tablet, Take 1 tablet (500 mg total) by mouth daily with breakfast., Disp: 90 tablet, Rfl: 1 .  rosuvastatin (CRESTOR) 40 MG tablet, Take 1 tablet (40 mg total) by mouth daily. In placed of Atorvastatin, Disp: 90 tablet, Rfl: 1  Allergies  Allergen Reactions  . Lisinopril Swelling    Angioedema in 2018    I personally reviewed active problem list,  medication list, allergies, family history, social history, health maintenance with the patient/caregiver today.   ROS  Constitutional: Negative for fever or weight change.  Respiratory: Negative for cough and shortness of breath.   Cardiovascular: Negative for chest pain or palpitations.  Gastrointestinal: Negative for abdominal pain, no bowel changes.  Musculoskeletal: Negative for gait problem or joint swelling.  Skin: Negative for rash.  Neurological: Negative for dizziness or headache.  No other specific complaints in a complete review of systems (except as listed in HPI above).  Objective  Vitals:   07/12/20 1128 07/12/20 1223  BP: (!) 160/96 (!) 160/91  Pulse: 70   Resp: 16   Temp: (!) 97.3 F (36.3 C)   TempSrc: Temporal   SpO2: 99%   Weight: 199 lb 14.4 oz (90.7 kg)   Height: 5\' 2"  (1.575 m)     Body mass index is 36.56 kg/m.  Physical Exam  Constitutional: Patient appears well-developed and well-nourished. Obese No distress.  HEENT: head atraumatic, normocephalic, pupils equal and reactive to light, , neck supple, throat within normal limits Cardiovascular: Normal rate, regular rhythm and normal heart sounds.  No murmur heard. Trace BLE edema. Pulmonary/Chest: Effort normal and breath sounds normal. No respiratory distress. Abdominal: Soft.  There is no  tenderness. Psychiatric: Patient has a normal mood and affect. behavior is normal. Judgment and thought content normal.  PHQ2/9: Depression screen [facility] 2/9 07/12/2020 12/04/2019 05/23/2019 11/17/2018 09/06/2018  Decreased Interest 0 0 0 0 0  Down, Depressed, Hopeless 0 0 0 0 0  PHQ - 2 Score 0 0 0 0 0  Altered sleeping 0 0 0 - 0  Tired, decreased energy 0 0 0 - 0  Change in appetite 0 0 0 - 0  Feeling bad or failure about yourself  0 0 0 - 0  Trouble concentrating 0 0 0 - 1  Moving slowly or fidgety/restless 0 0 0 - 0  Suicidal thoughts 0 0 0 - 0  PHQ-9 Score 0 0 0 - 1  Difficult doing work/chores - Not  difficult at all Not difficult at all - Not difficult at all    phq 9 is negative   Fall Risk: Fall Risk  07/12/2020 12/04/2019 05/23/2019 11/17/2018 09/06/2018  Falls in the past year? 0 0 0 0 No  Number falls in past yr: 0 0 0 0 -  Injury with Fall? 0 0 0 0 -  Follow up - Falls evaluation completed Falls evaluation completed - -    Functional Status Survey: Is the patient deaf or have difficulty hearing?: No Does the patient have difficulty seeing, even when wearing glasses/contacts?: No Does the patient have difficulty concentrating, remembering, or making decisions?: No Does the patient have difficulty walking or climbing stairs?: No Does the patient have difficulty dressing or bathing?: No Does the patient have difficulty doing errands alone such as visiting  a doctor's office or shopping?: No    Assessment & Plan  1. Acquired hypothyroidism  - levothyroxine (SYNTHROID) 50 MCG tablet; Take 1 tablet (50 mcg total) by mouth daily before breakfast.  Dispense: 90 tablet; Refill: 1  2. Benign hypertension with chronic kidney disease, stage I  - amLODipine (NORVASC) 5 MG tablet; Take 1 tablet (5 mg total) by mouth daily.  Dispense: 90 tablet; Refill: 1 - Microalbumin / creatinine urine ratio We will add hctz since bp remained elevated  3. Microalbuminuria  Recheck it today   4. Prediabetes  - POCT glycosylated hemoglobin (Hb A1C) - metFORMIN (GLUCOPHAGE-XR) 500 MG 24 hr tablet; Take 1 tablet (500 mg total) by mouth daily with breakfast.  Dispense: 90 tablet; Refill: 1  5. Palpitations   6. Morbid obesity (HCC)  Discussed with the patient the risk posed by an increased BMI. Discussed importance of portion control, calorie counting and at least 150 minutes of physical activity weekly. Avoid sweet beverages and drink more water. Eat at least 6 servings of fruit and vegetables daily   7. Dyslipidemia  - rosuvastatin (CRESTOR) 40 MG tablet; Take 1 tablet (40 mg total) by  mouth daily. In placed of Atorvastatin  Dispense: 90 tablet; Refill: 1

## 2020-07-12 NOTE — Patient Instructions (Signed)
Please take two pills of Atorvastatin 20 mg until bottle is gone After that start new prescription of Crestor/Rosuvastatin 40 mg daily and we will recheck your labs on your next visit

## 2020-07-13 LAB — MICROALBUMIN / CREATININE URINE RATIO
Creatinine, Urine: 37 mg/dL (ref 20–275)
Microalb Creat Ratio: 14 mcg/mg creat (ref <30)
Microalb, Ur: 0.5 mg/dL

## 2020-07-19 ENCOUNTER — Ambulatory Visit: Payer: Managed Care, Other (non HMO)

## 2020-07-19 ENCOUNTER — Other Ambulatory Visit: Payer: Self-pay

## 2020-07-19 VITALS — BP 138/80 | HR 86 | Resp 16

## 2020-07-19 DIAGNOSIS — N181 Chronic kidney disease, stage 1: Secondary | ICD-10-CM

## 2020-07-19 NOTE — Progress Notes (Signed)
Patient is here for a blood pressure check. Patient denies chest pain, palpitations, shortness of breath or visual disturbances. At previous visit blood pressure was 160/91 with a heart rate of 70. Today during nurse visit blood pressure was 138/80 with a heart rate of 86.She does take any blood pressure medications.

## 2020-08-09 ENCOUNTER — Other Ambulatory Visit: Payer: Self-pay | Admitting: Family Medicine

## 2020-08-09 MED ORDER — HYDROCHLOROTHIAZIDE 12.5 MG PO TABS: 12.5000 mg | ORAL_TABLET | Freq: Every day | ORAL | 0 refills | Status: DC

## 2020-08-09 NOTE — Telephone Encounter (Signed)
Copied from CRM [id number]. Topic: Quick Communication - Rx Refill/Question >> Aug 09, 2020 11:00 AM Jaquita Rector A wrote: Medication: hydrochlorothiazide (HYDRODIURIL) 12.5 MG tablet  Has the patient contacted their pharmacy? Yes.   (Agent: If no, request that the patient contact the pharmacy for the refill.) (Agent: If yes, when and what did the pharmacy advise?)  Preferred Pharmacy (with phone number or street name): [facility] [address] [place] - [address]  Phone:  [phone] Fax:  [phone]     Agent: Please be advised that RX refills may take up to 3 business days. We ask that you follow-up with your pharmacy.

## 2020-09-12 ENCOUNTER — Other Ambulatory Visit: Payer: Self-pay | Admitting: Family Medicine

## 2020-09-12 MED ORDER — HYDROCHLOROTHIAZIDE 12.5 MG PO TABS: 12.5000 mg | ORAL_TABLET | Freq: Every day | ORAL | 0 refills | Status: DC

## 2020-10-01 ENCOUNTER — Encounter: Payer: Self-pay | Admitting: Family Medicine

## 2020-10-02 ENCOUNTER — Encounter: Payer: Self-pay | Admitting: Family Medicine

## 2020-10-02 ENCOUNTER — Other Ambulatory Visit: Payer: Self-pay

## 2020-10-02 ENCOUNTER — Ambulatory Visit: Payer: Managed Care, Other (non HMO) | Admitting: Emergency Medicine

## 2020-10-02 NOTE — Progress Notes (Signed)
Patient here for weight check for form completion.  Pt weight is 201.6.

## 2020-10-10 ENCOUNTER — Other Ambulatory Visit: Payer: Self-pay

## 2020-10-10 DIAGNOSIS — I129 Hypertensive chronic kidney disease with stage 1 through stage 4 chronic kidney disease, or unspecified chronic kidney disease: Secondary | ICD-10-CM

## 2020-10-11 ENCOUNTER — Ambulatory Visit: Payer: Managed Care, Other (non HMO) | Admitting: Family Medicine

## 2020-10-11 ENCOUNTER — Other Ambulatory Visit: Payer: Self-pay

## 2020-10-11 ENCOUNTER — Encounter: Payer: Self-pay | Admitting: Family Medicine

## 2020-10-11 VITALS — BP 137/76 | HR 81 | Temp 98.3°F | Resp 16 | Ht 62.0 in | Wt 206.3 lb

## 2020-10-11 DIAGNOSIS — I129 Hypertensive chronic kidney disease with stage 1 through stage 4 chronic kidney disease, or unspecified chronic kidney disease: Secondary | ICD-10-CM | POA: Diagnosis not present

## 2020-10-11 DIAGNOSIS — E559 Vitamin D deficiency, unspecified: Secondary | ICD-10-CM

## 2020-10-11 DIAGNOSIS — R7303 Prediabetes: Secondary | ICD-10-CM

## 2020-10-11 DIAGNOSIS — E039 Hypothyroidism, unspecified: Secondary | ICD-10-CM

## 2020-10-11 DIAGNOSIS — N181 Chronic kidney disease, stage 1: Secondary | ICD-10-CM

## 2020-10-11 DIAGNOSIS — R809 Proteinuria, unspecified: Secondary | ICD-10-CM | POA: Diagnosis not present

## 2020-10-11 DIAGNOSIS — E785 Hyperlipidemia, unspecified: Secondary | ICD-10-CM

## 2020-10-11 MED ORDER — METFORMIN HCL ER 500 MG PO TB24: 500.0000 mg | ORAL_TABLET | Freq: Every day | ORAL | 1 refills | Status: DC

## 2020-10-11 MED ORDER — HYDROCHLOROTHIAZIDE 12.5 MG PO TABS: 12.5000 mg | ORAL_TABLET | Freq: Every day | ORAL | 1 refills | Status: DC

## 2020-10-11 MED ORDER — AMLODIPINE BESYLATE 5 MG PO TABS: 5.0000 mg | ORAL_TABLET | Freq: Every day | ORAL | 1 refills | Status: DC

## 2020-10-11 MED ORDER — LEVOTHYROXINE SODIUM 50 MCG PO TABS: 50.0000 ug | ORAL_TABLET | Freq: Every day | ORAL | 1 refills | Status: DC

## 2020-10-11 MED ORDER — METOPROLOL SUCCINATE ER 100 MG PO TB24: 100.0000 mg | ORAL_TABLET | Freq: Every day | ORAL | 1 refills | Status: DC

## 2020-10-11 MED ORDER — ROSUVASTATIN CALCIUM 40 MG PO TABS: 40.0000 mg | ORAL_TABLET | Freq: Every day | ORAL | 1 refills | Status: DC

## 2020-10-11 NOTE — Progress Notes (Signed)
Name: Sherry Patel   MRN: [id number]    DOB: 1966/10/14   Date:10/11/2020       Progress Note  Subjective  Chief Complaint  Chief Complaint  Patient presents with   Follow-up    HPI  Hypothyroidism: she has been taking levothyroxine 50 mcg daily, last TSH was normal Has had palpitations intermittently for many years - they occur when she's stressedbut it is sporadic now. She has hair thinning for years - likely from chemicals. Weight has been trending up but stable. She denies dysphagia or change in bowel movement  HTN: BP is at goal today, taking medication as prescribed , no side effects. She has CKI, but cannot take ACE/ARB due to history of angioedema, we will continue current dose of medications at this time  CKD: Had elevated microalbumin in May 2019; kidney function has been stable, denies pruritis, we will recheck labs next visit   Hyperlipidemia: Last LDL was 127, we changed from Atorvastatin to Rosuvastatin on her last visit, we will recheck labs during her CPE   Morbid Obesity/Prediabetes: BMI above 35 with co-morbidities such as HTN, dyslipidemia, pre-diabetes. She states since menopause she has been craving carbohydrates. She states weight used to be around 175 lbs but over the past few years gaining more, worse during the pandemic when she started to work from home, and stopped doing her daily walks. She recently joined a gym but only went twice so far, discussed routine, protein and fruit snacks. Discussed weight watchers also, she has done it in the past with success.   Patient Active Problem List   Diagnosis Date Noted   Prediabetes 12/04/2019   Pure hypercholesterolemia 12/04/2019   Microalbuminuria 05/23/2019   Acquired hypothyroidism 12/31/2015   IFG (impaired fasting glucose) 09/25/2015   Hypertensive chronic kidney disease 09/25/2015   Morbid obesity due to excess calories (HCC) 09/25/2015   Menopause 09/25/2015    Past Surgical History:   Procedure Laterality Date   breast biopy Left 20 years ago   BREAST EXCISIONAL BIOPSY      Family History  Problem Relation Age of Onset   Hypertension Mother    Hypertension Maternal Grandmother    Hypertension Maternal Grandfather     Social History   Tobacco Use   Smoking status: Former Smoker    Packs/day: 1.00    Years: 20.00    Pack years: 20.00   Smokeless tobacco: Never Used   Tobacco comment: Quit around 1999  Substance Use Topics   Alcohol use: No     Current Outpatient Medications:    amLODipine (NORVASC) 5 MG tablet, Take 1 tablet (5 mg total) by mouth daily., Disp: 90 tablet, Rfl: 1   Ascorbic Acid (VITAMIN C) 100 MG tablet, Take 100 mg by mouth daily., Disp: , Rfl:    aspirin 81 MG chewable tablet, Chew by mouth daily., Disp: , Rfl:    hydrochlorothiazide (HYDRODIURIL) 12.5 MG tablet, Take 1 tablet (12.5 mg total) by mouth daily., Disp: 30 tablet, Rfl: 0   levothyroxine (SYNTHROID) 50 MCG tablet, Take 1 tablet (50 mcg total) by mouth daily before breakfast., Disp: 90 tablet, Rfl: 1   metFORMIN (GLUCOPHAGE-XR) 500 MG 24 hr tablet, Take 1 tablet (500 mg total) by mouth daily with breakfast., Disp: 90 tablet, Rfl: 1   metoprolol succinate (TOPROL-XL) 100 MG 24 hr tablet, TAKE 1 TABLET BY MOUTH  DAILY WITH OR IMMEDIATELY  FOLLOWING A MEAL, Disp: 90 tablet, Rfl: 3   Omega-3 Fatty Acids (FISH OIL)  1000 MG CAPS, Take by mouth., Disp: , Rfl:    rosuvastatin (CRESTOR) 40 MG tablet, Take 1 tablet (40 mg total) by mouth daily. In placed of Atorvastatin, Disp: 90 tablet, Rfl: 1   thiamine (VITAMIN B-1) 100 MG tablet, Take 100 mg by mouth daily., Disp: , Rfl:   Allergies  Allergen Reactions   Lisinopril Swelling    Angioedema in 2018    I personally reviewed active problem list, medication list, allergies, family history, social history, health maintenance with the patient/caregiver today.   ROS  Constitutional: Negative for fever or weight  change.  Respiratory: Negative for cough and shortness of breath.   Cardiovascular: Negative for chest pain but still has occasional  palpitations.  Gastrointestinal: Negative for abdominal pain, no bowel changes.  Musculoskeletal: Negative for gait problem or joint swelling.  Skin: Negative for rash.  Neurological: Negative for dizziness or headache.  No other specific complaints in a complete review of systems (except as listed in HPI above).  Objective  Vitals:   10/11/20 1051  BP: 137/76  Pulse: 81  Resp: 16  Temp: 98.3 F (36.8 C)  TempSrc: Oral  SpO2: 99%  Weight: 206 lb 4.8 oz (93.6 kg)  Height: 5\' 2"  (1.575 m)    Body mass index is 37.73 kg/m.  Physical Exam  Constitutional: Patient appears well-developed and well-nourished. Obese  No distress.  HEENT: head atraumatic, normocephalic, pupils equal and reactive to light, neck supple Cardiovascular: Normal rate, regular rhythm and normal heart sounds.  No murmur heard. No BLE edema. Pulmonary/Chest: Effort normal and breath sounds normal. No respiratory distress. Abdominal: Soft.  There is no tenderness. Psychiatric: Patient has a normal mood and affect. behavior is normal. Judgment and thought content normal.  PHQ2/9: Depression screen [facility] 2/9 10/11/2020 07/12/2020 12/04/2019 05/23/2019 11/17/2018  Decreased Interest 0 0 0 0 0  Down, Depressed, Hopeless 0 0 0 0 0  PHQ - 2 Score 0 0 0 0 0  Altered sleeping - 0 0 0 -  Tired, decreased energy - 0 0 0 -  Change in appetite - 0 0 0 -  Feeling bad or failure about yourself  - 0 0 0 -  Trouble concentrating - 0 0 0 -  Moving slowly or fidgety/restless - 0 0 0 -  Suicidal thoughts - 0 0 0 -  PHQ-9 Score - 0 0 0 -  Difficult doing work/chores - - Not difficult at all Not difficult at all -    phq 9 is negative   Fall Risk: Fall Risk  10/11/2020 07/12/2020 12/04/2019 05/23/2019 11/17/2018  Falls in the past year? 0 0 0 0 0  Number falls in past yr: 0 0 0 0 0  Injury with  Fall? 0 0 0 0 0  Follow up - - Falls evaluation completed Falls evaluation completed -     Functional Status Survey: Is the patient deaf or have difficulty hearing?: No Does the patient have difficulty seeing, even when wearing glasses/contacts?: No Does the patient have difficulty concentrating, remembering, or making decisions?: No Does the patient have difficulty walking or climbing stairs?: No Does the patient have difficulty dressing or bathing?: No Does the patient have difficulty doing errands alone such as visiting a doctor's office or shopping?: No    Assessment & Plan   1. Benign hypertension with chronic kidney disease, stage I  - hydrochlorothiazide (HYDRODIURIL) 12.5 MG tablet; Take 1 tablet (12.5 mg total) by mouth daily.  Dispense: 90 tablet; Refill:  1 - amLODipine (NORVASC) 5 MG tablet; Take 1 tablet (5 mg total) by mouth daily.  Dispense: 90 tablet; Refill: 1 - metoprolol succinate (TOPROL-XL) 100 MG 24 hr tablet; Take 1 tablet (100 mg total) by mouth daily. Take with or immediately following a meal.  Dispense: 90 tablet; Refill: 1 - Microalbumin / creatinine urine ratio - CBC with Differential/Platelet - Comprehensive metabolic panel  2. Morbid obesity (HCC)  Discussed with the patient the risk posed by an increased BMI. Discussed importance of portion control, calorie counting and at least 150 minutes of physical activity weekly. Avoid sweet beverages and drink more water. Eat at least 6 servings of fruit and vegetables daily   3. Microalbuminuria  - Microalbumin / creatinine urine ratio  4. Prediabetes  - metFORMIN (GLUCOPHAGE-XR) 500 MG 24 hr tablet; Take 1 tablet (500 mg total) by mouth daily with breakfast.  Dispense: 90 tablet; Refill: 1 - Hemoglobin A1c  5. Acquired hypothyroidism  - levothyroxine (SYNTHROID) 50 MCG tablet; Take 1 tablet (50 mcg total) by mouth daily before breakfast.  Dispense: 90 tablet; Refill: 1 - TSH  6. Dyslipidemia  -  rosuvastatin (CRESTOR) 40 MG tablet; Take 1 tablet (40 mg total) by mouth daily. In placed of Atorvastatin  Dispense: 90 tablet; Refill: 1 - Lipid panel  7. Vitamin D deficiency  - VITAMIN D 25 Hydroxy (Vit-D Deficiency, Fractures)

## 2020-11-29 ENCOUNTER — Other Ambulatory Visit: Payer: Self-pay | Admitting: Family Medicine

## 2020-11-29 DIAGNOSIS — R7303 Prediabetes: Secondary | ICD-10-CM

## 2020-11-29 MED ORDER — METFORMIN HCL ER 500 MG PO TB24: 500.0000 mg | ORAL_TABLET | Freq: Every day | ORAL | 0 refills | Status: DC

## 2020-11-29 NOTE — Telephone Encounter (Signed)
Copied from CRM [id number]. Topic: Quick Communication - Rx Refill/Question >> Nov 29, 2020 12:49 PM Mcneil, Ja-Kwan wrote: Medication: metFORMIN (GLUCOPHAGE-XR) 500 MG 24 hr tablet  Has the patient contacted their pharmacy? no  Preferred Pharmacy (with phone number or street name): [facility] - [place], [place] - [address] [place], [address] Phone: [phone] Fax: [phone]  Agent: Please be advised that RX refills may take up to 3 business days. We ask that you follow-up with your pharmacy.

## 2020-12-02 NOTE — Progress Notes (Signed)
Name: Sherry Patel   MRN: [id number]    DOB: [birth year]   Date:12/03/2020       Progress Note  Subjective  Chief Complaint  Annual Exam   HPI  Patient presents for annual CPE.  Diet: eating more salads, more baked meals and cutting down on carbs Exercise: increase to 150 minutes per week    Strong Visit from 12/04/2019 in [facility]  AUDIT-C Score 0     Depression: Phq 9 is  negative Depression screen [facility] 2/9 12/03/2020 10/11/2020 07/12/2020 12/04/2019 05/23/2019  Decreased Interest 0 0 0 0 0  Down, Depressed, Hopeless 0 0 0 0 0  PHQ - 2 Score 0 0 0 0 0  Altered sleeping 1 - 0 0 0  Tired, decreased energy 0 - 0 0 0  Change in appetite 0 - 0 0 0  Feeling bad or failure about yourself  0 - 0 0 0  Trouble concentrating 0 - 0 0 0  Moving slowly or fidgety/restless 0 - 0 0 0  Suicidal thoughts 0 - 0 0 0  PHQ-9 Score 1 - 0 0 0  Difficult doing work/chores - - - Not difficult at all Not difficult at all   Hypertension: BP Readings from Last 3 Encounters:  12/03/20 136/88  10/11/20 137/76  07/19/20 138/80   Obesity: Wt Readings from Last 3 Encounters:  12/03/20 202 lb 1.6 oz (91.7 kg)  10/11/20 206 lb 4.8 oz (93.6 kg)  10/02/20 201 lb 9.6 oz (91.4 kg)   BMI Readings from Last 3 Encounters:  12/03/20 36.96 kg/m  10/11/20 37.73 kg/m  10/02/20 36.87 kg/m     Vaccines:   Shingrix:she will get it today  Pneumonia: N/A educated and discussed with patient. Flu: Declined, educated and discussed with patient.  Hep C Screening: 05/23/2019 STD testing and prevention (HIV/chl/gon/syphilis): not interested  Intimate partner violence: negative screen  Sexual History :same sexual partner for the past 20 years  Menstrual History/LMP/Abnormal Bleeding: discussed the need of follow up if any post-menopausal bleeding  Incontinence Symptoms: no problems   Breast cancer:  - Last Mammogram: 04/23/2020 - BRCA gene screening: N/A  Osteoporosis:  Discussed high calcium and vitamin D supplementation, weight bearing exercises  Cervical cancer screening: 05/23/2019  Skin cancer: Discussed monitoring for atypical lesions  Colorectal cancer: 06/10/2019  Lung cancer:   Low Dose CT Chest recommended if Age 44-80 years, 20 pack-year currently smoking OR have quit w/in 15years. Patient does not qualify.   ECG: 05/23/2019  Advanced Care Planning: A voluntary discussion about advance care planning including the explanation and discussion of advance directives.  Discussed health care proxy and Living will, and the patient was able to identify a health care proxy as husband .  Patient does have a living will at present time.   Lipids: Lab Results  Component Value Date   CHOL 189 12/04/2019   CHOL 199 05/23/2019   CHOL 165 04/20/2018   Lab Results  Component Value Date   HDL 43 (L) 12/04/2019   HDL 49 05/23/2019   HDL 44 04/20/2018   Lab Results  Component Value Date   LDLCALC 127 (H) 12/04/2019   LDLCALC 132 (H) 05/23/2019   LDLCALC 105 (H) 04/20/2018   Lab Results  Component Value Date   TRIG 88 12/04/2019   TRIG 91 05/23/2019   TRIG 82 04/20/2018   Lab Results  Component Value Date   CHOLHDL 4.4 12/04/2019   CHOLHDL 4.1 05/23/2019  No results found for: LDLDIRECT  Glucose: Glucose  Date Value Ref Range Status  05/23/2019 85 65 - 99 mg/dL Final  11/17/2018 86 65 - 99 mg/dL Final  04/20/2018 82 65 - 99 mg/dL Final   Glucose, Bld  Date Value Ref Range Status  12/04/2019 82 65 - 99 mg/dL Final    Comment:    .            Fasting reference interval .     Patient Active Problem List   Diagnosis Date Noted  . Prediabetes 12/04/2019  . Pure hypercholesterolemia 12/04/2019  . Microalbuminuria 05/23/2019  . Acquired hypothyroidism 12/31/2015  . IFG (impaired fasting glucose) 09/25/2015  . Hypertensive chronic kidney disease 09/25/2015  . Morbid obesity due to excess calories ([place]) 09/25/2015  . Menopause  09/25/2015    Past Surgical History:  Procedure Laterality Date  . breast biopy Left 20 years ago  . BREAST EXCISIONAL BIOPSY      Family History  Problem Relation Age of Onset  . Hypertension Mother   . Hypertension Maternal Grandmother   . Hypertension Maternal Grandfather     Social History   Socioeconomic History  . Marital status: Married    Spouse name: Not on file  . Number of children: 4  . Years of education: Not on file  . Highest education level: High school graduate  Occupational History  . Occupation: Teacher, early years/pre: [employer]  Tobacco Use  . Smoking status: Former Smoker    Packs/day: 1.00    Years: 20.00    Pack years: 20.00  . Smokeless tobacco: Never Used  . Tobacco comment: Quit around [place] Use  . Vaping Use: Never used  Substance and Sexual Activity  . Alcohol use: No  . Drug use: No  . Sexual activity: Yes    Partners: Male  Other Topics Concern  . Not on file  Social History Narrative  . Not on file   Social Determinants of Health   Financial Resource Strain: Low Risk   . Difficulty of Paying Living Expenses: Not hard at all  Food Insecurity: No Food Insecurity  . Worried About Charity fundraiser in the Last Year: Never true  . Ran Out of Food in the Last Year: Never true  Transportation Needs: No Transportation Needs  . Lack of Transportation (Medical): No  . Lack of Transportation (Non-Medical): No  Physical Activity: Insufficiently Active  . Days of Exercise per Week: 3 days  . Minutes of Exercise per Session: 20 min  Stress: No Stress Concern Present  . Feeling of Stress : Only a little  Social Connections: Socially Integrated  . Frequency of Communication with Friends and Family: Twice a week  . Frequency of Social Gatherings with Friends and Family: Once a week  . Attends Religious Services: More than 4 times per year  . Active Member of Clubs or Organizations: Yes  . Attends Archivist  Meetings: More than 4 times per year  . Marital Status: Married  Human resources officer Violence: Unknown  . Fear of Current or Ex-Partner: No  . Emotionally Abused: Patient refused  . Physically Abused: No  . Sexually Abused: No     Current Outpatient Medications:  .  amLODipine (NORVASC) 5 MG tablet, Take 1 tablet (5 mg total) by mouth daily., Disp: 90 tablet, Rfl: 1 .  Ascorbic Acid (VITAMIN C) 100 MG tablet, Take 100 mg by mouth daily., Disp: , Rfl:  .  aspirin 81 MG chewable tablet, Chew by mouth daily., Disp: , Rfl:  .  hydrochlorothiazide (HYDRODIURIL) 12.5 MG tablet, Take 1 tablet (12.5 mg total) by mouth daily., Disp: 90 tablet, Rfl: 1 .  levothyroxine (SYNTHROID) 50 MCG tablet, Take 1 tablet (50 mcg total) by mouth daily before breakfast., Disp: 90 tablet, Rfl: 1 .  metFORMIN (GLUCOPHAGE-XR) 500 MG 24 hr tablet, Take 1 tablet (500 mg total) by mouth daily with breakfast., Disp: 90 tablet, Rfl: 0 .  metoprolol succinate (TOPROL-XL) 100 MG 24 hr tablet, Take 1 tablet (100 mg total) by mouth daily. Take with or immediately following a meal., Disp: 90 tablet, Rfl: 1 .  Multiple Vitamins-Minerals (MULTIVITAMIN WITH MINERALS) tablet, Take 1 tablet by mouth daily., Disp: , Rfl:  .  Omega-3 Fatty Acids (FISH OIL) 1000 MG CAPS, Take by mouth., Disp: , Rfl:  .  rosuvastatin (CRESTOR) 40 MG tablet, Take 1 tablet (40 mg total) by mouth daily. In placed of Atorvastatin, Disp: 90 tablet, Rfl: 1  Allergies  Allergen Reactions  . Lisinopril Swelling    Angioedema in 2018     ROS  Constitutional: Negative for fever or weight change.  Respiratory: Negative for cough and shortness of breath.   Cardiovascular: Negative for chest pain or palpitations.  Gastrointestinal: Negative for abdominal pain, no bowel changes.  Musculoskeletal: Negative for gait problem or joint swelling.  Skin: Negative for rash.  Neurological: Negative for dizziness or headache.  No other specific complaints in a  complete review of systems (except as listed in HPI above).  Objective  Vitals:   12/03/20 1031  BP: 136/88  Pulse: 94  Resp: 16  Temp: 97.9 F (36.6 C)  TempSrc: Oral  SpO2: 95%  Weight: 202 lb 1.6 oz (91.7 kg)  Height: $Remove'5\' 2"'[id number]$  (1.575 m)    Body mass index is 36.96 kg/m.  Physical Exam  Constitutional: Patient appears well-developed and well-nourished. No distress.  HENT: Head: Normocephalic and atraumatic. Ears: B TMs ok, no erythema or effusion; Nose: Not done. Mouth/Throat: not done  Eyes: Conjunctivae and EOM are normal. Pupils are equal, round, and reactive to light. No scleral icterus.  Neck: Normal range of motion. Neck supple. No JVD present. No thyromegaly present.  Cardiovascular: Normal rate, regular rhythm and normal heart sounds.  No murmur heard. No BLE edema. Pulmonary/Chest: Effort normal and breath sounds normal. No respiratory distress. Abdominal: Soft. Bowel sounds are normal, no distension. There is no tenderness. no masses Breast: no lumps or masses, no nipple discharge or rashes FEMALE GENITALIA:  Not done RECTAL: not done  Musculoskeletal: Normal range of motion, no joint effusions. No gross deformities Neurological: he is alert and oriented to person, place, and time. No cranial nerve deficit. Coordination, balance, strength, speech and gait are normal.  Skin: Skin is warm and dry. No rash noted. No erythema.  Psychiatric: Patient has a normal mood and affect. behavior is normal. Judgment and thought content normal.  Fall Risk: Fall Risk  12/03/2020 10/11/2020 07/12/2020 12/04/2019 05/23/2019  Falls in the past year? 0 0 0 0 0  Number falls in past yr: 0 0 0 0 0  Injury with Fall? 0 0 0 0 0  Follow up - - - Falls evaluation completed Falls evaluation completed     Functional Status Survey: Is the patient deaf or have difficulty hearing?: No Does the patient have difficulty seeing, even when wearing glasses/contacts?: No Does the patient have  difficulty concentrating, remembering, or making decisions?: No Does  the patient have difficulty walking or climbing stairs?: No Does the patient have difficulty dressing or bathing?: No Does the patient have difficulty doing errands alone such as visiting a doctor's office or shopping?: No   Assessment & Plan  1. Well adult exam   2. Need for shingles vaccine  Next visit   -USPSTF grade A and B recommendations reviewed with patient; age-appropriate recommendations, preventive care, screening tests, etc discussed and encouraged; healthy living encouraged; see AVS for patient education given to patient -Discussed importance of 150 minutes of physical activity weekly, eat two servings of fish weekly, eat one serving of tree nuts ( cashews, pistachios, pecans, almonds.Marland Kitchen) every other day, eat 6 servings of fruit/vegetables daily and drink plenty of water and avoid sweet beverages.

## 2020-12-03 ENCOUNTER — Ambulatory Visit (INDEPENDENT_AMBULATORY_CARE_PROVIDER_SITE_OTHER): Payer: Managed Care, Other (non HMO) | Admitting: Family Medicine

## 2020-12-03 ENCOUNTER — Other Ambulatory Visit: Payer: Self-pay

## 2020-12-03 ENCOUNTER — Encounter: Payer: Self-pay | Admitting: Family Medicine

## 2020-12-03 VITALS — BP 136/88 | HR 94 | Temp 97.9°F | Resp 16 | Ht 62.0 in | Wt 202.1 lb

## 2020-12-03 DIAGNOSIS — Z0000 Encounter for general adult medical examination without abnormal findings: Secondary | ICD-10-CM

## 2020-12-03 DIAGNOSIS — Z1231 Encounter for screening mammogram for malignant neoplasm of breast: Secondary | ICD-10-CM

## 2020-12-03 NOTE — Addendum Note (Signed)
Addended by: Dollene Primrose on: 12/03/2020 11:14 AM   Modules accepted: Orders

## 2020-12-03 NOTE — Patient Instructions (Signed)

## 2020-12-04 LAB — COMPREHENSIVE METABOLIC PANEL
ALT: 19 IU/L (ref 0–32)
AST: 17 IU/L (ref 0–40)
Albumin/Globulin Ratio: 1.3 (ref 1.2–2.2)
Albumin: 4.6 g/dL (ref 3.8–4.9)
Alkaline Phosphatase: 98 IU/L (ref 44–121)
BUN/Creatinine Ratio: 20 (ref 9–23)
BUN: 15 mg/dL (ref 6–24)
Bilirubin Total: 0.3 mg/dL (ref 0.0–1.2)
CO2: 25 mmol/L (ref 20–29)
Calcium: 9.7 mg/dL (ref 8.7–10.2)
Chloride: 100 mmol/L (ref 96–106)
Creatinine, Ser: 0.75 mg/dL (ref 0.57–1.00)
GFR calc Af Amer: 103 mL/min/{1.73_m2} (ref >59)
GFR calc non Af Amer: 89 mL/min/{1.73_m2} (ref >59)
Globulin, Total: 3.6 g/dL (ref 1.5–4.5)
Glucose: 82 mg/dL (ref 65–99)
Potassium: 4.2 mmol/L (ref 3.5–5.2)
Sodium: 140 mmol/L (ref 134–144)
Total Protein: 8.2 g/dL (ref 6.0–8.5)

## 2020-12-04 LAB — CBC WITH DIFFERENTIAL/PLATELET
Basophils Absolute: 0.1 10*3/uL (ref 0.0–0.2)
Basos: 1 %
EOS (ABSOLUTE): 0.2 10*3/uL (ref 0.0–0.4)
Eos: 2 %
Hematocrit: 40.3 % (ref 34.0–46.6)
Hemoglobin: 13.1 g/dL (ref 11.1–15.9)
Immature Grans (Abs): 0 10*3/uL (ref 0.0–0.1)
Immature Granulocytes: 0 %
Lymphocytes Absolute: 3.5 10*3/uL — ABNORMAL HIGH (ref 0.7–3.1)
Lymphs: 31 %
MCH: 28.7 pg (ref 26.6–33.0)
MCHC: 32.5 g/dL (ref 31.5–35.7)
MCV: 88 fL (ref 79–97)
Monocytes Absolute: 0.8 10*3/uL (ref 0.1–0.9)
Monocytes: 7 %
Neutrophils Absolute: 6.6 10*3/uL (ref 1.4–7.0)
Neutrophils: 59 %
Platelets: 383 10*3/uL (ref 150–450)
RBC: 4.56 x10E6/uL (ref 3.77–5.28)
RDW: 13.8 % (ref 11.7–15.4)
WBC: 11.2 10*3/uL — ABNORMAL HIGH (ref 3.4–10.8)

## 2020-12-04 LAB — TSH: TSH: 1.82 u[IU]/mL (ref 0.450–4.500)

## 2020-12-04 LAB — HEMOGLOBIN A1C
Est. average glucose Bld gHb Est-mCnc: 146 mg/dL
Hgb A1c MFr Bld: 6.7 % — ABNORMAL HIGH (ref 4.8–5.6)

## 2020-12-04 LAB — LIPID PANEL
Chol/HDL Ratio: 3.7 ratio (ref 0.0–4.4)
Cholesterol, Total: 169 mg/dL (ref 100–199)
HDL: 46 mg/dL (ref >39)
LDL Chol Calc (NIH): 106 mg/dL — ABNORMAL HIGH (ref 0–99)
Triglycerides: 93 mg/dL (ref 0–149)
VLDL Cholesterol Cal: 17 mg/dL (ref 5–40)

## 2020-12-04 LAB — MICROALBUMIN / CREATININE URINE RATIO
Creatinine, Urine: 41.9 mg/dL
Microalb/Creat Ratio: 8 mg/g creat (ref 0–29)
Microalbumin, Urine: 3.3 ug/mL

## 2020-12-04 LAB — VITAMIN D 25 HYDROXY (VIT D DEFICIENCY, FRACTURES): Vit D, 25-Hydroxy: 44.6 ng/mL (ref 30.0–100.0)

## 2021-01-30 ENCOUNTER — Other Ambulatory Visit: Payer: Self-pay | Admitting: Family Medicine

## 2021-01-30 DIAGNOSIS — R7303 Prediabetes: Secondary | ICD-10-CM

## 2021-02-24 ENCOUNTER — Other Ambulatory Visit: Payer: Self-pay | Admitting: Family Medicine

## 2021-02-24 DIAGNOSIS — I129 Hypertensive chronic kidney disease with stage 1 through stage 4 chronic kidney disease, or unspecified chronic kidney disease: Secondary | ICD-10-CM

## 2021-03-25 ENCOUNTER — Other Ambulatory Visit: Payer: Self-pay | Admitting: Family Medicine

## 2021-03-25 ENCOUNTER — Ambulatory Visit: Payer: Managed Care, Other (non HMO) | Admitting: Family Medicine

## 2021-03-25 DIAGNOSIS — I129 Hypertensive chronic kidney disease with stage 1 through stage 4 chronic kidney disease, or unspecified chronic kidney disease: Secondary | ICD-10-CM

## 2021-03-25 NOTE — Telephone Encounter (Signed)
Requested Prescriptions  Pending Prescriptions Disp Refills  . metoprolol succinate (TOPROL-XL) 100 MG 24 hr tablet [Pharmacy Med Name: Metoprolol Succinate ER 100 MG Oral Tablet Extended Release 24 Hour] 90 tablet 0    Sig: TAKE 1 TABLET BY MOUTH  DAILY WITH OR IMMEDIATELY  FOLLOWING A MEAL     Cardiovascular:  Beta Blockers Passed - 03/25/2021 10:10 PM      Passed - Last BP in normal range    BP Readings from Last 1 Encounters:  12/03/20 136/88         Passed - Last Heart Rate in normal range    Pulse Readings from Last 1 Encounters:  12/03/20 94         Passed - Valid encounter within last 6 months    Recent Outpatient Visits          3 months ago Well adult exam   [facility] Alba Cory, MD   5 months ago Benign hypertension with chronic kidney disease, stage I   [facility] Alba Cory, MD   8 months ago Acquired hypothyroidism   [facility] Alba Cory, MD   1 year ago Acquired hypothyroidism   [facility] Doren Custard, FNP   1 year ago Well woman exam   [facility] Doren Custard, FNP      Future Appointments            In 3 weeks Alba Cory, MD [facility]

## 2021-04-14 ENCOUNTER — Ambulatory Visit: Payer: Managed Care, Other (non HMO) | Admitting: Family Medicine

## 2021-04-18 NOTE — Progress Notes (Signed)
Name: Sherry Patel   MRN: [id number]    DOB: [birth year]   Date:04/21/2021       Progress Note  Subjective  Chief Complaint  Follow Up  HPI  New onset DM: last A1C in Dec was 6.7 %, today A1C is 6.3% , she states since last visit she has been more conscious about her diet and taking a vitamin called blood sugar support, she has been taking Metformin 500 mg for a long time , no side effects of medications.  She denies polyphagia, polydipsia or polyuria. She has a history of dyslipidemia and obesity. She is taking Crestor daily. Discussed options, we will try Rybelsus to see if helps her lose weight and keep A1C under control, she will stop Metformin for now   Hypothyroidism: she has been taking levothyroxine 50 mcg daily, last TSH was normal Has had palpitations intermittently for many years - they occur when she's stressedbut it is sporadic now.  She denies dysphagia or change in bowel movement  HTN: BP is at goal today, taking medication as prescribed , no side effects. She has CKI, but cannot take ACE/ARB due to history of angioedema, continue current regiment   CKD: Had elevated microalbumin in May 2019; kidney function has been stable, denies pruritis, we will recheck labs next visit   Hyperlipidemia: Last LDL was 127, down to 106 on Rosuvastatin 40 mg daily, explained goal is now below 70. Discussed adding Zetia but she wants to hold off for now   Morbid Obesity BMI above 35 with co-morbidities such as HTN, dyslipidemia, diabetes type II She states since menopause she has been craving carbohydrates. She states weight used to be around 175 lbs but over the past few years gaining more, worse during the pandemic when she started to work from home, she is now working at [employer] currently and is a sitting down job again ( previous at [employer] [facility]), she is trying to cut down on sweets, she has not going to the gym.    Patient Active Problem List   Diagnosis Date Noted  . Prediabetes  12/04/2019  . Pure hypercholesterolemia 12/04/2019  . Microalbuminuria 05/23/2019  . Acquired hypothyroidism 12/31/2015  . IFG (impaired fasting glucose) 09/25/2015  . Hypertensive chronic kidney disease 09/25/2015  . Morbid obesity due to excess calories (HCC) 09/25/2015  . Menopause 09/25/2015    Past Surgical History:  Procedure Laterality Date  . breast biopy Left 20 years ago  . BREAST EXCISIONAL BIOPSY      Family History  Problem Relation Age of Onset  . Hypertension Mother   . Hypertension Maternal Grandmother   . Hypertension Maternal Grandfather     Social History   Tobacco Use  . Smoking status: Former Smoker    Packs/day: 1.00    Years: 20.00    Pack years: 20.00  . Smokeless tobacco: Never Used  . Tobacco comment: Quit around 1999  Substance Use Topics  . Alcohol use: No     Current Outpatient Medications:  .  Ascorbic Acid (VITAMIN C) 100 MG tablet, Take 100 mg by mouth daily., Disp: , Rfl:  .  Multiple Vitamins-Minerals (MULTIVITAMIN WITH MINERALS) tablet, Take 1 tablet by mouth daily., Disp: , Rfl:  .  Omega-3 Fatty Acids (FISH OIL) 1000 MG CAPS, Take by mouth., Disp: , Rfl:  .  amLODipine (NORVASC) 5 MG tablet, Take 1 tablet (5 mg total) by mouth daily., Disp: 90 tablet, Rfl: 1 .  hydrochlorothiazide (HYDRODIURIL) 12.5 MG tablet, Take  1 tablet (12.5 mg total) by mouth daily., Disp: 90 tablet, Rfl: 1 .  levothyroxine (SYNTHROID) 50 MCG tablet, Take 1 tablet (50 mcg total) by mouth daily before breakfast., Disp: 90 tablet, Rfl: 1 .  metoprolol succinate (TOPROL-XL) 100 MG 24 hr tablet, Take 1 tablet (100 mg total) by mouth daily. Take with or immediately following a meal., Disp: 90 tablet, Rfl: 1 .  rosuvastatin (CRESTOR) 40 MG tablet, Take 1 tablet (40 mg total) by mouth daily. In placed of Atorvastatin, Disp: 90 tablet, Rfl: 1 .  Semaglutide (RYBELSUS) 7 MG TABS, Take 7 mg by mouth daily., Disp: 90 tablet, Rfl: 0  Allergies  Allergen Reactions  .  Lisinopril Swelling    Angioedema in 2018    I personally reviewed active problem list, medication list, allergies, family history, social history, health maintenance with the patient/caregiver today.   ROS  Constitutional: Negative for fever or weight change.  Respiratory: Negative for cough and shortness of breath.   Cardiovascular: Negative for chest pain or palpitations.  Gastrointestinal: Negative for abdominal pain, no bowel changes.  Musculoskeletal: Negative for gait problem or joint swelling.  Skin: Negative for rash.  Neurological: Negative for dizziness or headache.  No other specific complaints in a complete review of systems (except as listed in HPI above).  Objective  Vitals:   04/21/21 1321  BP: 130/84  Pulse: 76  Resp: 16  Temp: 98.3 F (36.8 C)  TempSrc: Oral  SpO2: 96%  Weight: 205 lb (93 kg)  Height: 5\' 2"  (1.575 m)    Body mass index is 37.49 kg/m.  Physical Exam  Constitutional: Patient appears well-developed and well-nourished. Obese  No distress.  HEENT: head atraumatic, normocephalic, pupils equal and reactive to light,  neck supple,  Cardiovascular: Normal rate, regular rhythm and normal heart sounds.  No murmur heard. No BLE edema. Pulmonary/Chest: Effort normal and breath sounds normal. No respiratory distress. Abdominal: Soft.  There is no tenderness. Psychiatric: Patient has a normal mood and affect. behavior is normal. Judgment and thought content normal.   Diabetic Foot Exam: Diabetic Foot Exam - Simple   Simple Foot Form Diabetic Foot exam was performed with the following findings: Yes 04/21/2021  1:47 PM  Visual Inspection No deformities, no ulcerations, no other skin breakdown bilaterally: Yes Sensation Testing Intact to touch and monofilament testing bilaterally: Yes Pulse Check Posterior Tibialis and Dorsalis pulse intact bilaterally: Yes Comments      PHQ2/9: Depression screen [facility] 2/9 04/21/2021 12/03/2020 10/11/2020  07/12/2020 12/04/2019  Decreased Interest 0 0 0 0 0  Down, Depressed, Hopeless 0 0 0 0 0  PHQ - 2 Score 0 0 0 0 0  Altered sleeping - 1 - 0 0  Tired, decreased energy - 0 - 0 0  Change in appetite - 0 - 0 0  Feeling bad or failure about yourself  - 0 - 0 0  Trouble concentrating - 0 - 0 0  Moving slowly or fidgety/restless - 0 - 0 0  Suicidal thoughts - 0 - 0 0  PHQ-9 Score - 1 - 0 0  Difficult doing work/chores - - - - Not difficult at all    phq 9 is negative  Fall Risk: Fall Risk  04/21/2021 12/03/2020 10/11/2020 07/12/2020 12/04/2019  Falls in the past year? 0 0 0 0 0  Number falls in past yr: 0 0 0 0 0  Injury with Fall? 0 0 0 0 0  Follow up - - - - Falls  evaluation completed    Functional Status Survey: Is the patient deaf or have difficulty hearing?: No Does the patient have difficulty seeing, even when wearing glasses/contacts?: No Does the patient have difficulty concentrating, remembering, or making decisions?: No Does the patient have difficulty walking or climbing stairs?: No Does the patient have difficulty dressing or bathing?: No Does the patient have difficulty doing errands alone such as visiting a doctor's office or shopping?: No   Assessment & Plan  1. Morbid obesity (HCC)  Discussed with the patient the risk posed by an increased BMI. Discussed importance of portion control, calorie counting and at least 150 minutes of physical activity weekly. Avoid sweet beverages and drink more water. Eat at least 6 servings of fruit and vegetables daily   2. Diabetes mellitus type 2 in obese (HCC)  - POCT HgB A1C - Semaglutide (RYBELSUS) 7 MG TABS; Take 7 mg by mouth daily.  Dispense: 30 tablet; Refill: 1  3. Microalbuminuria   4. Benign hypertension with chronic kidney disease, stage I  - amLODipine (NORVASC) 5 MG tablet; Take 1 tablet (5 mg total) by mouth daily.  Dispense: 90 tablet; Refill: 1 - hydrochlorothiazide (HYDRODIURIL) 12.5 MG tablet; Take 1 tablet  (12.5 mg total) by mouth daily.  Dispense: 90 tablet; Refill: 1 - metoprolol succinate (TOPROL-XL) 100 MG 24 hr tablet; Take 1 tablet (100 mg total) by mouth daily. Take with or immediately following a meal.  Dispense: 90 tablet; Refill: 1  5. Acquired hypothyroidism  - levothyroxine (SYNTHROID) 50 MCG tablet; Take 1 tablet (50 mcg total) by mouth daily before breakfast.  Dispense: 90 tablet; Refill: 1  6. Dyslipidemia  - rosuvastatin (CRESTOR) 40 MG tablet; Take 1 tablet (40 mg total) by mouth daily. In placed of Atorvastatin  Dispense: 90 tablet; Refill: 1  7. Vitamin D deficiency

## 2021-04-21 ENCOUNTER — Other Ambulatory Visit: Payer: Self-pay | Admitting: Family Medicine

## 2021-04-21 ENCOUNTER — Encounter: Payer: Self-pay | Admitting: Family Medicine

## 2021-04-21 ENCOUNTER — Ambulatory Visit (INDEPENDENT_AMBULATORY_CARE_PROVIDER_SITE_OTHER): Payer: Managed Care, Other (non HMO) | Admitting: Family Medicine

## 2021-04-21 ENCOUNTER — Other Ambulatory Visit: Payer: Self-pay

## 2021-04-21 DIAGNOSIS — E559 Vitamin D deficiency, unspecified: Secondary | ICD-10-CM

## 2021-04-21 DIAGNOSIS — N181 Chronic kidney disease, stage 1: Secondary | ICD-10-CM

## 2021-04-21 DIAGNOSIS — E785 Hyperlipidemia, unspecified: Secondary | ICD-10-CM

## 2021-04-21 DIAGNOSIS — R809 Proteinuria, unspecified: Secondary | ICD-10-CM | POA: Diagnosis not present

## 2021-04-21 DIAGNOSIS — E669 Obesity, unspecified: Secondary | ICD-10-CM

## 2021-04-21 DIAGNOSIS — E1169 Type 2 diabetes mellitus with other specified complication: Secondary | ICD-10-CM | POA: Diagnosis not present

## 2021-04-21 DIAGNOSIS — Z1231 Encounter for screening mammogram for malignant neoplasm of breast: Secondary | ICD-10-CM

## 2021-04-21 DIAGNOSIS — I129 Hypertensive chronic kidney disease with stage 1 through stage 4 chronic kidney disease, or unspecified chronic kidney disease: Secondary | ICD-10-CM

## 2021-04-21 DIAGNOSIS — E039 Hypothyroidism, unspecified: Secondary | ICD-10-CM

## 2021-04-21 LAB — POCT GLYCOSYLATED HEMOGLOBIN (HGB A1C): Hemoglobin A1C: 6.3 % — AB (ref 4.0–5.6)

## 2021-04-21 MED ORDER — ROSUVASTATIN CALCIUM 40 MG PO TABS: 40.0000 mg | ORAL_TABLET | Freq: Every day | ORAL | 1 refills | Status: DC

## 2021-04-21 MED ORDER — RYBELSUS 7 MG PO TABS: 7.0000 mg | ORAL_TABLET | Freq: Every day | ORAL | 0 refills | Status: DC

## 2021-04-21 MED ORDER — RYBELSUS 7 MG PO TABS: 7.0000 mg | ORAL_TABLET | Freq: Every day | ORAL | 1 refills | Status: DC

## 2021-04-21 MED ORDER — HYDROCHLOROTHIAZIDE 12.5 MG PO TABS: 12.5000 mg | ORAL_TABLET | Freq: Every day | ORAL | 1 refills | Status: DC

## 2021-04-21 MED ORDER — LEVOTHYROXINE SODIUM 50 MCG PO TABS
50.0000 ug | ORAL_TABLET | Freq: Every day | ORAL | 1 refills | Status: DC
Start: 2021-04-21 — End: 2021-10-17

## 2021-04-21 MED ORDER — METOPROLOL SUCCINATE ER 100 MG PO TB24: 100.0000 mg | ORAL_TABLET | Freq: Every day | ORAL | 1 refills | Status: DC

## 2021-04-21 MED ORDER — AMLODIPINE BESYLATE 5 MG PO TABS: 5.0000 mg | ORAL_TABLET | Freq: Every day | ORAL | 1 refills | Status: DC

## 2021-04-29 ENCOUNTER — Ambulatory Visit
Admission: RE | Admit: 2021-04-29 | Discharge: 2021-04-29 | Disposition: A | Discharge status: Home / Self Care | Payer: Managed Care, Other (non HMO) | Source: Ambulatory Visit | Attending: Family Medicine | Admitting: Family Medicine

## 2021-04-29 ENCOUNTER — Other Ambulatory Visit: Payer: Self-pay

## 2021-04-29 DIAGNOSIS — Z1231 Encounter for screening mammogram for malignant neoplasm of breast: Secondary | ICD-10-CM | POA: Diagnosis not present

## 2021-06-25 ENCOUNTER — Encounter: Payer: Self-pay | Admitting: Unknown Physician Specialty

## 2021-06-25 ENCOUNTER — Other Ambulatory Visit: Payer: Self-pay

## 2021-06-25 ENCOUNTER — Ambulatory Visit: Payer: Self-pay | Admitting: *Deleted

## 2021-06-25 ENCOUNTER — Ambulatory Visit: Payer: Managed Care, Other (non HMO) | Admitting: Unknown Physician Specialty

## 2021-06-25 ENCOUNTER — Ambulatory Visit: Payer: Managed Care, Other (non HMO) | Admitting: Family Medicine

## 2021-06-25 VITALS — BP 134/82 | HR 94 | Temp 98.0°F | Resp 16 | Ht 62.0 in | Wt 204.2 lb

## 2021-06-25 DIAGNOSIS — L239 Allergic contact dermatitis, unspecified cause: Secondary | ICD-10-CM | POA: Diagnosis not present

## 2021-06-25 MED ORDER — PREDNISONE 10 MG (21) PO TBPK: ORAL_TABLET | ORAL | 0 refills | Status: DC

## 2021-06-25 MED ORDER — MOMETASONE FUROATE 0.1 % EX CREA: TOPICAL_CREAM | CUTANEOUS | 1 refills | Status: DC

## 2021-06-25 NOTE — Progress Notes (Signed)
BP 134/82   Pulse 94   Temp 98 F (36.7 C) (Oral)   Resp 16   Ht 5\' 2"  (1.575 m)   Wt 204 lb 3.2 oz (92.6 kg)   LMP  (LMP Unknown)   SpO2 98%   BMI 37.35 kg/m    Subjective:    Patient ID: Sherry Patel, female    DOB: 19-Apr-1966, 55 y.o.   MRN: [id number]  HPI: Sherry Patel is a 55 y.o. female  Chief Complaint  Patient presents with   Rash    On face , feet and ankle   Pt with a new rash that started last week around ankles and a spot on the right side of face.  Not has dryness and swelling opn her face though the spot on ankles is better  Rash This is a new problem. The problem has been gradually worsening since onset. The affected locations include the face, lips, left ankle and right ankle. The rash is characterized by blistering, dryness, itchiness and peeling. She was exposed to nothing. Associated symptoms include facial edema. Pertinent negatives include no anorexia, congestion, cough, diarrhea, eye pain, fatigue, fever, joint pain, nail changes, rhinorrhea, shortness of breath, sore throat or vomiting. Past treatments include nothing (Apple cider vinegar, triple ab ointment, alcohol, anti-fungal cream). There is no history of allergies, asthma or eczema.   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 Interim medical history since our last visit reviewed. Allergies and medications reviewed and updated.  Review of Systems  Constitutional:  Negative for fatigue and fever.  HENT:  Negative for congestion, rhinorrhea and sore throat.   Eyes:  Negative for pain.  Respiratory:  Negative for cough and shortness of breath.   Gastrointestinal:  Negative for anorexia, diarrhea and vomiting.  Musculoskeletal:  Negative for joint pain.  Skin:  Positive for rash. Negative for nail changes.   Per HPI unless specifically indicated above     Objective:    BP 134/82   Pulse  94   Temp 98 F (36.7 C) (Oral)   Resp 16   Ht 5\' 2"  (1.575 m)   Wt 204 lb 3.2 oz (92.6 kg)   LMP  (LMP Unknown)   SpO2 98%   BMI 37.35 kg/m   Wt Readings from Last 3 Encounters:  06/25/21 204 lb 3.2 oz (92.6 kg)  04/21/21 205 lb (93 kg)  12/03/20 202 lb 1.6 oz (91.7 kg)    Physical Exam Constitutional:      General: She is not in acute distress.    Appearance: Normal appearance. She is well-developed.  HENT:     Head: Normocephalic and atraumatic.  Eyes:     General: Lids are normal. No scleral icterus.       Right eye: No discharge.        Left eye: No discharge.     Conjunctiva/sclera: Conjunctivae normal.  Neck:     Vascular: No carotid bruit or JVD.  Cardiovascular:     Rate and Rhythm: Normal rate and regular rhythm.     Heart sounds: Normal heart sounds.  Pulmonary:     Effort: Pulmonary effort is normal. No respiratory distress.     Breath sounds: Normal breath sounds.  Abdominal:     Palpations: There is no hepatomegaly or splenomegaly.  Musculoskeletal:        General: Normal range of motion.  Cervical back: Normal range of motion and neck supple.  Skin:    General: Skin is warm and dry.     Coloration: Skin is not pale.     Findings: Rash present.     Comments: Generalized swelling and dry skin on face.  Most pronounced around mouth.    Neurological:     Mental Status: She is alert and oriented to person, place, and time.  Psychiatric:        Behavior: Behavior normal.        Thought Content: Thought content normal.        Judgment: Judgment normal.    Results for orders placed or performed in visit on 04/21/21  POCT HgB A1C  Result Value Ref Range   Hemoglobin A1C 6.3 (A) 4.0 - 5.6 %   HbA1c POC (<> result, manual entry)     HbA1c, POC (prediabetic range)     HbA1c, POC (controlled diabetic range)        Assessment & Plan:   Problem List Items Addressed This Visit   None Visit Diagnoses     Allergic contact dermatitis, unspecified  trigger    -  Primary   unsure the irritant.  Rx for short steroid taper due to DM.  Will rx Elecon cream for use on face.  Pt ed on future outbreaks.         DM check up in 1 month and will need to address microalbumin  Follow up plan: Return if symptoms worsen or fail to improve.

## 2021-06-25 NOTE — Telephone Encounter (Signed)
Patient is calling to request appointment- she developed a rash on her face that is spreading. Patient states her OTC/home remedies have not helped- she also states the rash was on her feet too. Appointment scheduled.

## 2021-06-25 NOTE — Telephone Encounter (Signed)
Reason for Disposition  Mild widespread rash  Answer Assessment - Initial Assessment Questions 1. APPEARANCE of RASH: "Describe the rash." (e.g., spots, blisters, raised areas, skin peeling, scaly)     Red raised areas 2. SIZE: "How big are the spots?" (e.g., tip of pen, eraser, coin; inches, centimeters)     Quarter size R cheek- now has spread to mouth to L cheek 3. LOCATION: "Where is the rash located?"     Face, possible rash on feet 4. COLOR: "What color is the rash?" (Note: It is difficult to assess rash color in people with darker-colored skin. When this situation occurs, simply ask the caller to describe what they see.)     red 5. ONSET: "When did the rash begin?"     Friday last week 6. FEVER: "Do you have a fever?" If Yes, ask: "What is your temperature, how was it measured, and when did it start?"     no 7. ITCHING: "Does the rash itch?" If Yes, ask: "How bad is the itch?" (Scale 1-10; or mild, moderate, severe)     Yes- mild 8. CAUSE: "What do you think is causing the rash?"     unsure 9. MEDICINE FACTORS: "Have you started any new medicines within the last 2 weeks?" (e.g., antibiotics)      no 10. OTHER SYMPTOMS: "Do you have any other symptoms?" (e.g., dizziness, headache, sore throat, joint pain)       no 11. PREGNANCY: "Is there any chance you are pregnant?" "When was your last menstrual period?"       N/a  Protocols used: Rash or Redness - [facility]

## 2021-07-22 NOTE — Progress Notes (Signed)
Name: Sherry Patel   MRN: [id number]    DOB: 1966/06/26   Date:07/25/2021       Progress Note  Subjective  Chief Complaint  Follow Up  HPI  DMII: last A1C in Dec was 6.7 %, last visit was 6.3 % and today is down to 6.1 %  She denies polyphagia, polydipsia or polyuria. She has a history of dyslipidemia, obesity and microalbuminuria. She cannot take ACE because of history of angioedema, discussed SGL2 antagonist and she is willing to try it . She has been eating smaller portion, no side effects of medications. She is not sure if taking Metformin, explained she can stop it for now. Continue Rybelsus and adding Jardiance today 25 mg   Hypothyroidism: she has been taking levothyroxine 50 mcg daily, last TSH was normal  Has had palpitations intermittently for many years - they occur when she's stressed but it is sporadic now - controlled with metoprolol .  She denies dysphagia, dry skin  or change in bowel movement   HTN: BP towards high end of normal,  taking medication as prescribed , no side effects. She has CKI, but cannot take ACE/ARB due to history of angioedema, adding Jardiance today    CKD: Had elevated microalbumin in May 2019; kidney function has been stable, denies pruritis. We will add Jardiance today for kidney protection   Hyperlipidemia: Last LDL was 127, down to 106 on Rosuvastatin 40 mg daily, explained goal is now below 70. Discussed adding Zetia but she wants to hold off for now, we will recheck it next visit    Morbid Obesity BMI above 35 with co-morbidities such as HTN, dyslipidemia, diabetes type II She states since menopause she has been craving carbohydrates. She states weight used to be around 175 lbs but over the past few years gaining more, worse during the pandemic when she started to work from home, she has a sedentary job, but walking more since she is at [employer] . She lost a few pounds since last visit    Patient Active Problem List   Diagnosis Date Noted   Diabetes  mellitus type 2 in obese (HCC) 07/25/2021   Prediabetes 12/04/2019   Pure hypercholesterolemia 12/04/2019   Microalbuminuria 05/23/2019   Acquired hypothyroidism 12/31/2015   IFG (impaired fasting glucose) 09/25/2015   Hypertensive chronic kidney disease 09/25/2015   Morbid obesity due to excess calories (HCC) 09/25/2015   Menopause 09/25/2015    Past Surgical History:  Procedure Laterality Date   breast biopy Left 20 years ago   BREAST EXCISIONAL BIOPSY Left    cyst removed    Family History  Problem Relation Age of Onset   Hypertension Mother    Hypertension Maternal Grandmother    Hypertension Maternal Grandfather     Social History   Tobacco Use   Smoking status: Former    Packs/day: 1.00    Years: 20.00    Pack years: 20.00    Types: Cigarettes   Smokeless tobacco: Never   Tobacco comments:    Quit around 1999  Substance Use Topics   Alcohol use: No     Current Outpatient Medications:    amLODipine (NORVASC) 5 MG tablet, Take 1 tablet (5 mg total) by mouth daily., Disp: 90 tablet, Rfl: 1   Ascorbic Acid (VITAMIN C) 100 MG tablet, Take 100 mg by mouth daily., Disp: , Rfl:    hydrochlorothiazide (HYDRODIURIL) 12.5 MG tablet, Take 1 tablet (12.5 mg total) by mouth daily., Disp: 90 tablet, Rfl:  1   levothyroxine (SYNTHROID) 50 MCG tablet, Take 1 tablet (50 mcg total) by mouth daily before breakfast., Disp: 90 tablet, Rfl: 1   metoprolol succinate (TOPROL-XL) 100 MG 24 hr tablet, Take 1 tablet (100 mg total) by mouth daily. Take with or immediately following a meal., Disp: 90 tablet, Rfl: 1   Multiple Vitamins-Minerals (MULTIVITAMIN WITH MINERALS) tablet, Take 1 tablet by mouth daily., Disp: , Rfl:    Omega-3 Fatty Acids (FISH OIL) 1000 MG CAPS, Take by mouth., Disp: , Rfl:    rosuvastatin (CRESTOR) 40 MG tablet, Take 1 tablet (40 mg total) by mouth daily. In placed of Atorvastatin, Disp: 90 tablet, Rfl: 1   Semaglutide (RYBELSUS) 7 MG TABS, Take 7 mg by mouth  daily., Disp: 90 tablet, Rfl: 0   mometasone (ELOCON) 0.1 % cream, Twice a day prn (Patient not taking: Reported on 07/25/2021), Disp: 15 g, Rfl: 1   predniSONE (STERAPRED UNI-PAK 21 TAB) 10 MG (21) TBPK tablet, Take 6 pills the first day with food and decrease by one pill every day (Patient not taking: Reported on 07/25/2021), Disp: 21 each, Rfl: 0  Allergies  Allergen Reactions   Lisinopril Swelling    Angioedema in 2018    I personally reviewed active problem list, medication list, allergies, family history, social history, health maintenance with the patient/caregiver today.   ROS  Constitutional: Negative for fever or weight change.  Respiratory: Negative for cough and shortness of breath.   Cardiovascular: Negative for chest pain or palpitations.  Gastrointestinal: Negative for abdominal pain, no bowel changes.  Musculoskeletal: Negative for gait problem or joint swelling.  Skin: Negative for rash.  Neurological: Negative for dizziness or headache.  No other specific complaints in a complete review of systems (except as listed in HPI above).   Objective  Vitals:   07/25/21 0750  BP: 134/86  Pulse: 81  Resp: 16  Temp: 98 F (36.7 C)  SpO2: 98%  Weight: 202 lb (91.6 kg)  Height: 5\' 2"  (1.575 m)    Body mass index is 36.95 kg/m.  Physical Exam  Constitutional: Patient appears well-developed and well-nourished. Obese  No distress.  HEENT: head atraumatic, normocephalic, pupils equal and reactive to light, neck supple Cardiovascular: Normal rate, regular rhythm and normal heart sounds.  No murmur heard. No BLE edema. Pulmonary/Chest: Effort normal and breath sounds normal. No respiratory distress. Abdominal: Soft.  There is no tenderness. Psychiatric: Patient has a normal mood and affect. behavior is normal. Judgment and thought content normal.   PHQ2/9: Depression screen [facility] 2/9 07/25/2021 06/25/2021 04/21/2021 12/03/2020 10/11/2020  Decreased Interest 0 0 0 0 0   Down, Depressed, Hopeless 0 0 0 0 0  PHQ - 2 Score 0 0 0 0 0  Altered sleeping - - - 1 -  Tired, decreased energy - - - 0 -  Change in appetite - - - 0 -  Feeling bad or failure about yourself  - - - 0 -  Trouble concentrating - - - 0 -  Moving slowly or fidgety/restless - - - 0 -  Suicidal thoughts - - - 0 -  PHQ-9 Score - - - 1 -  Difficult doing work/chores - - - - -  Some recent data might be hidden    phq 9 is negative   Fall Risk: Fall Risk  07/25/2021 06/25/2021 04/21/2021 12/03/2020 10/11/2020  Falls in the past year? 0 0 0 0 0  Number falls in past yr: 0 0 0 0  0  Injury with Fall? 0 0 0 0 0  Risk for fall due to : No Fall Risks - - - -  Follow up Falls prevention discussed Falls evaluation completed - - -     Functional Status Survey: Is the patient deaf or have difficulty hearing?: No Does the patient have difficulty seeing, even when wearing glasses/contacts?: No Does the patient have difficulty concentrating, remembering, or making decisions?: No Does the patient have difficulty walking or climbing stairs?: No Does the patient have difficulty dressing or bathing?: No Does the patient have difficulty doing errands alone such as visiting a doctor's office or shopping?: No    Assessment & Plan  1. Diabetes mellitus type 2 in obese (HCC)  - POCT HgB A1C - Semaglutide (RYBELSUS) 7 MG TABS; Take 7 mg by mouth daily.  Dispense: 90 tablet; Refill: 0 - empagliflozin (JARDIANCE) 25 MG TABS tablet; Take 1 tablet (25 mg total) by mouth daily before breakfast.  Dispense: 90 tablet; Refill: 0 - Microalbumin / creatinine urine ratio - Lipid panel - Hemoglobin A1c  2. Morbid obesity (HCC)  Discussed with the patient the risk posed by an increased BMI. Discussed importance of portion control, calorie counting and at least 150 minutes of physical activity weekly. Avoid sweet beverages and drink more water. Eat at least 6 servings of fruit and vegetables daily    3. Benign  hypertension with chronic kidney disease, stage I  - Microalbumin / creatinine urine ratio - Comprehensive metabolic panel - CBC with Differential/Platelet  4. Acquired hypothyroidism  - TSH  5. Vitamin D deficiency  - VITAMIN D 25 Hydroxy (Vit-D Deficiency, Fractures)  6. Dyslipidemia   7. Microalbuminuria  - empagliflozin (JARDIANCE) 25 MG TABS tablet; Take 1 tablet (25 mg total) by mouth daily before breakfast.  Dispense: 90 tablet; Refill: 0 - Microalbumin / creatinine urine ratio  8. Need for Tdap vaccination  - Tdap vaccine greater than or equal to 7yo IM

## 2021-07-25 ENCOUNTER — Other Ambulatory Visit: Payer: Self-pay

## 2021-07-25 ENCOUNTER — Ambulatory Visit: Payer: Managed Care, Other (non HMO) | Admitting: Family Medicine

## 2021-07-25 ENCOUNTER — Encounter: Payer: Self-pay | Admitting: Family Medicine

## 2021-07-25 VITALS — BP 134/86 | HR 81 | Temp 98.0°F | Resp 16 | Ht 62.0 in | Wt 202.0 lb

## 2021-07-25 DIAGNOSIS — E1169 Type 2 diabetes mellitus with other specified complication: Secondary | ICD-10-CM | POA: Diagnosis not present

## 2021-07-25 DIAGNOSIS — I129 Hypertensive chronic kidney disease with stage 1 through stage 4 chronic kidney disease, or unspecified chronic kidney disease: Secondary | ICD-10-CM

## 2021-07-25 DIAGNOSIS — E039 Hypothyroidism, unspecified: Secondary | ICD-10-CM | POA: Diagnosis not present

## 2021-07-25 DIAGNOSIS — E669 Obesity, unspecified: Secondary | ICD-10-CM | POA: Diagnosis not present

## 2021-07-25 DIAGNOSIS — R809 Proteinuria, unspecified: Secondary | ICD-10-CM

## 2021-07-25 DIAGNOSIS — Z23 Encounter for immunization: Secondary | ICD-10-CM

## 2021-07-25 DIAGNOSIS — E559 Vitamin D deficiency, unspecified: Secondary | ICD-10-CM

## 2021-07-25 DIAGNOSIS — E785 Hyperlipidemia, unspecified: Secondary | ICD-10-CM

## 2021-07-25 DIAGNOSIS — N181 Chronic kidney disease, stage 1: Secondary | ICD-10-CM

## 2021-07-25 LAB — POCT GLYCOSYLATED HEMOGLOBIN (HGB A1C): Hemoglobin A1C: 6.1 % — AB (ref 4.0–5.6)

## 2021-07-25 MED ORDER — RYBELSUS 7 MG PO TABS: 7.0000 mg | ORAL_TABLET | Freq: Every day | ORAL | 0 refills | Status: DC

## 2021-07-25 MED ORDER — EMPAGLIFLOZIN 25 MG PO TABS: 25.0000 mg | ORAL_TABLET | Freq: Every day | ORAL | 0 refills | Status: DC

## 2021-10-17 ENCOUNTER — Other Ambulatory Visit: Payer: Self-pay | Admitting: Family Medicine

## 2021-10-17 DIAGNOSIS — E039 Hypothyroidism, unspecified: Secondary | ICD-10-CM

## 2021-10-17 MED ORDER — LEVOTHYROXINE SODIUM 50 MCG PO TABS: 50.0000 ug | ORAL_TABLET | Freq: Every day | ORAL | 0 refills | Status: DC

## 2021-10-17 NOTE — Telephone Encounter (Signed)
Requested Prescriptions  Pending Prescriptions Disp Refills  . levothyroxine (SYNTHROID) 50 MCG tablet 90 tablet 0    Sig: Take 1 tablet (50 mcg total) by mouth daily before breakfast.     Endocrinology:  Hypothyroid Agents Failed - 10/17/2021  4:00 PM      Failed - TSH needs to be rechecked within 3 months after an abnormal result. Refill until TSH is due.      Passed - TSH in normal range and within 360 days    TSH  Date Value Ref Range Status  12/03/2020 1.820 0.450 - 4.500 uIU/mL Final         Passed - Valid encounter within last 12 months    Recent Outpatient Visits          2 months ago Diabetes mellitus type 2 in obese [facility])   [facility] Alba Cory, MD   3 months ago Allergic contact dermatitis, unspecified trigger   [facility] Gabriel Cirri, NP   5 months ago Morbid obesity [facility])   [facility] Alba Cory, MD   10 months ago Well adult exam   [facility] Alba Cory, MD   1 year ago Benign hypertension with chronic kidney disease, stage I   [facility] Alba Cory, MD      Future Appointments            In 1 week Alba Cory, MD [facility] [place] [facility]

## 2021-10-17 NOTE — Telephone Encounter (Signed)
Copied from CRM [id number]. Topic: Quick Communication - Rx Refill/Question >> Oct 17, 2021  3:20 PM Marylen Ponto wrote: Medication: levothyroxine (SYNTHROID) 50 MCG tablet  Has the patient contacted their pharmacy? Yes.  Pt request Rx be sent to a new pharmacy (Agent: If no, request that the patient contact the pharmacy for the refill. If patient does not wish to contact the pharmacy document the reason why and proceed with request.) (Agent: If yes, when and what did the pharmacy advise?)  Preferred Pharmacy (with phone number or street name): [facility] [address] [place]), [place] - [address] Phone: [phone]  Fax: [phone]  Has the patient been seen for an appointment in the last year OR does the patient have an upcoming appointment? Yes.    Agent: Please be advised that RX refills may take up to 3 business days. We ask that you follow-up with your pharmacy.

## 2021-10-24 NOTE — Progress Notes (Signed)
Name: Sherry Patel   MRN: [id number]    DOB: 10/08/1966   Date:10/27/2021       Progress Note  Subjective  Chief Complaint  Follow Up  HPI  DMII: last A1C in Dec 21 was 6.7 %after that  6.3 %, 6.1 % and today is 5.9 %  She denies polyphagia, polydipsia or polyuria. She has a history of dyslipidemia, obesity and microalbuminuria. She cannot take ACE because of history of angioedema, unable to tolerate Jardiance but has been taking Rybelsus for the past 6 months and is lowing weight and tolerating medication well    Hypothyroidism: she has been taking levothyroxine 50 mcg daily, last TSH was normal  Has had palpitations intermittently for many years - they occur when she's stressed but it is sporadic now - controlled with metoprolol .  She denies dysphagia, dry skin  or change in bowel movement. She is compliant with medications    HTN: BP is stable, she is taking medication as prescribed , no side effects. She has CKI, but cannot take ACE/ARB due to history of angioedema, unable to tolerate Jardiance    CKD: Had elevated microalbumin in May 2019; kidney function has been stable, denies pruritis. We tried adding Jardiance on her last visit but unable to tolerate, she states it causes sore throat, she stopped and tried again a couple of weeks later and it happened again    Hyperlipidemia: Last LDL was 127, down to 106 , she states not sure why but she has not been taking Rosuvastatin at this time.    Obesity: She states since menopause she has been craving carbohydrates. She states weight used to be around 175 lbs but gradually gained weight over the past few years, she was 205 in May 2022 started on Rybelsus and she has lost 15  lbs since started on medication , no longer morbidly obese   Patient Active Problem List   Diagnosis Date Noted   Diabetes mellitus type 2 in obese ([place]) 07/25/2021   Prediabetes 12/04/2019   Pure hypercholesterolemia 12/04/2019   Microalbuminuria 05/23/2019    Acquired hypothyroidism 12/31/2015   IFG (impaired fasting glucose) 09/25/2015   Hypertensive chronic kidney disease 09/25/2015   Morbid obesity due to excess calories ([place]) 09/25/2015   Menopause 09/25/2015    Past Surgical History:  Procedure Laterality Date   breast biopy Left 20 years ago   BREAST EXCISIONAL BIOPSY Left    cyst removed    Family History  Problem Relation Age of Onset   Hypertension Mother    Hypertension Maternal Grandmother    Hypertension Maternal Grandfather     Social History   Tobacco Use   Smoking status: Former    Packs/day: 1.00    Years: 20.00    Pack years: 20.00    Types: Cigarettes   Smokeless tobacco: Never   Tobacco comments:    Quit around 1999  Substance Use Topics   Alcohol use: No     Current Outpatient Medications:    Ascorbic Acid (VITAMIN C) 100 MG tablet, Take 100 mg by mouth daily., Disp: , Rfl:    levothyroxine (SYNTHROID) 50 MCG tablet, Take 1 tablet (50 mcg total) by mouth daily before breakfast., Disp: 90 tablet, Rfl: 0   Multiple Vitamins-Minerals (MULTIVITAMIN WITH MINERALS) tablet, Take 1 tablet by mouth daily., Disp: , Rfl:    Omega-3 Fatty Acids (FISH OIL) 1000 MG CAPS, Take by mouth., Disp: , Rfl:    amLODipine (NORVASC) 5 MG tablet,  Take 1 tablet (5 mg total) by mouth daily., Disp: 90 tablet, Rfl: 1   hydrochlorothiazide (HYDRODIURIL) 12.5 MG tablet, Take 1 tablet (12.5 mg total) by mouth daily., Disp: 90 tablet, Rfl: 1   metoprolol succinate (TOPROL-XL) 100 MG 24 hr tablet, Take 1 tablet (100 mg total) by mouth daily. Take with or immediately following a meal., Disp: 90 tablet, Rfl: 1   rosuvastatin (CRESTOR) 40 MG tablet, Take 1 tablet (40 mg total) by mouth daily. In placed of Atorvastatin (Patient not taking: Reported on 10/27/2021), Disp: 90 tablet, Rfl: 1   Semaglutide (RYBELSUS) 7 MG TABS, Take 7 mg by mouth daily., Disp: 90 tablet, Rfl: 0  Allergies  Allergen Reactions   Lisinopril Swelling    Angioedema  in 2018    I personally reviewed active problem list, medication list, allergies, family history, social history, health maintenance with the patient/caregiver today.   ROS  Constitutional: Negative for fever, positive for mild  weight change.  Respiratory: Negative for cough and shortness of breath.   Cardiovascular: Negative for chest pain or palpitations.  Gastrointestinal: Negative for abdominal pain, no bowel changes.  Musculoskeletal: Negative for gait problem or joint swelling.  Skin: Negative for rash.  Neurological: Negative for dizziness or headache.  No other specific complaints in a complete review of systems (except as listed in HPI above).   Objective  Vitals:   10/27/21 0734  BP: 132/84  Pulse: 78  Resp: 16  Temp: 98.2 F (36.8 C)  SpO2: 98%  Weight: 190 lb (86.2 kg)  Height: 5\' 2"  (1.575 m)    Body mass index is 34.75 kg/m.  Physical Exam  Constitutional: Patient appears well-developed and well-nourished. Obese  No distress.  HEENT: head atraumatic, normocephalic, pupils equal and reactive to light, neck supple Cardiovascular: Normal rate, regular rhythm and normal heart sounds.  No murmur heard. No BLE edema. Pulmonary/Chest: Effort normal and breath sounds normal. No respiratory distress. Abdominal: Soft.  There is no tenderness. Psychiatric: Patient has a normal mood and affect. behavior is normal. Judgment and thought content normal.   PHQ2/9: Depression screen [facility] 2/9 10/27/2021 07/25/2021 06/25/2021 04/21/2021 12/03/2020  Decreased Interest 0 0 0 0 0  Down, Depressed, Hopeless 0 0 0 0 0  PHQ - 2 Score 0 0 0 0 0  Altered sleeping 0 - - - 1  Tired, decreased energy 0 - - - 0  Change in appetite 0 - - - 0  Feeling bad or failure about yourself  0 - - - 0  Trouble concentrating 0 - - - 0  Moving slowly or fidgety/restless 0 - - - 0  Suicidal thoughts 0 - - - 0  PHQ-9 Score 0 - - - 1  Difficult doing work/chores - - - - -  Some recent data might  be hidden    phq 9 is negative   Fall Risk: Fall Risk  10/27/2021 07/25/2021 06/25/2021 04/21/2021 12/03/2020  Falls in the past year? 0 0 0 0 0  Number falls in past yr: 0 0 0 0 0  Injury with Fall? 0 0 0 0 0  Risk for fall due to : No Fall Risks No Fall Risks - - -  Follow up Falls prevention discussed Falls prevention discussed Falls evaluation completed - -      Functional Status Survey: Is the patient deaf or have difficulty hearing?: No Does the patient have difficulty seeing, even when wearing glasses/contacts?: No Does the patient have difficulty concentrating, remembering,  or making decisions?: No Does the patient have difficulty walking or climbing stairs?: No Does the patient have difficulty dressing or bathing?: No Does the patient have difficulty doing errands alone such as visiting a doctor's office or shopping?: No    Assessment & Plan  1. Diabetes mellitus type 2 in obese (HCC)  - POCT HgB A1C - Semaglutide (RYBELSUS) 7 MG TABS; Take 7 mg by mouth daily.  Dispense: 90 tablet; Refill: 0  2. Need for immunization against influenza  - Flu Vaccine QUAD 6+ mos PF IM (Fluarix Quad PF)  3. Benign hypertension with chronic kidney disease, stage I  - amLODipine (NORVASC) 5 MG tablet; Take 1 tablet (5 mg total) by mouth daily.  Dispense: 90 tablet; Refill: 1 - metoprolol succinate (TOPROL-XL) 100 MG 24 hr tablet; Take 1 tablet (100 mg total) by mouth daily. Take with or immediately following a meal.  Dispense: 90 tablet; Refill: 1 - hydrochlorothiazide (HYDRODIURIL) 12.5 MG tablet; Take 1 tablet (12.5 mg total) by mouth daily.  Dispense: 90 tablet; Refill: 1  4. Dyslipidemia   5. Need for vaccination for pneumococcus  - Pneumococcal conjugate vaccine 20-valent (Prevnar 20)

## 2021-10-27 ENCOUNTER — Other Ambulatory Visit: Payer: Self-pay

## 2021-10-27 ENCOUNTER — Ambulatory Visit: Payer: Managed Care, Other (non HMO) | Admitting: Family Medicine

## 2021-10-27 ENCOUNTER — Encounter: Payer: Self-pay | Admitting: Family Medicine

## 2021-10-27 VITALS — BP 132/84 | HR 78 | Temp 98.2°F | Resp 16 | Ht 62.0 in | Wt 190.0 lb

## 2021-10-27 DIAGNOSIS — N181 Chronic kidney disease, stage 1: Secondary | ICD-10-CM

## 2021-10-27 DIAGNOSIS — E1169 Type 2 diabetes mellitus with other specified complication: Secondary | ICD-10-CM

## 2021-10-27 DIAGNOSIS — Z23 Encounter for immunization: Secondary | ICD-10-CM

## 2021-10-27 DIAGNOSIS — E785 Hyperlipidemia, unspecified: Secondary | ICD-10-CM | POA: Diagnosis not present

## 2021-10-27 DIAGNOSIS — E669 Obesity, unspecified: Secondary | ICD-10-CM

## 2021-10-27 DIAGNOSIS — I129 Hypertensive chronic kidney disease with stage 1 through stage 4 chronic kidney disease, or unspecified chronic kidney disease: Secondary | ICD-10-CM

## 2021-10-27 LAB — POCT GLYCOSYLATED HEMOGLOBIN (HGB A1C): Hemoglobin A1C: 5.9 % — AB (ref 4.0–5.6)

## 2021-10-27 MED ORDER — AMLODIPINE BESYLATE 5 MG PO TABS: 5.0000 mg | ORAL_TABLET | Freq: Every day | ORAL | 1 refills | Status: DC

## 2021-10-27 MED ORDER — METOPROLOL SUCCINATE ER 100 MG PO TB24: 100.0000 mg | ORAL_TABLET | Freq: Every day | ORAL | 1 refills | Status: DC

## 2021-10-27 MED ORDER — RYBELSUS 7 MG PO TABS: 7.0000 mg | ORAL_TABLET | Freq: Every day | ORAL | 0 refills | Status: DC

## 2021-10-27 MED ORDER — HYDROCHLOROTHIAZIDE 12.5 MG PO TABS: 12.5000 mg | ORAL_TABLET | Freq: Every day | ORAL | 1 refills | Status: DC

## 2021-10-27 NOTE — Patient Instructions (Signed)
Please call insurance to find out if they pay for Shingrix vaccine

## 2021-10-28 LAB — VITAMIN D 25 HYDROXY (VIT D DEFICIENCY, FRACTURES): Vit D, 25-Hydroxy: 55.3 ng/mL (ref 30.0–100.0)

## 2021-10-29 LAB — CBC WITH DIFFERENTIAL/PLATELET
Basophils Absolute: 0 10*3/uL (ref 0.0–0.2)
Basos: 1 %
EOS (ABSOLUTE): 0.1 10*3/uL (ref 0.0–0.4)
Eos: 2 %
Hematocrit: 38.2 % (ref 34.0–46.6)
Hemoglobin: 12.5 g/dL (ref 11.1–15.9)
Immature Grans (Abs): 0 10*3/uL (ref 0.0–0.1)
Immature Granulocytes: 0 %
Lymphocytes Absolute: 2.5 10*3/uL (ref 0.7–3.1)
Lymphs: 31 %
MCH: 28.1 pg (ref 26.6–33.0)
MCHC: 32.7 g/dL (ref 31.5–35.7)
MCV: 86 fL (ref 79–97)
Monocytes Absolute: 0.5 10*3/uL (ref 0.1–0.9)
Monocytes: 6 %
Neutrophils Absolute: 4.9 10*3/uL (ref 1.4–7.0)
Neutrophils: 60 %
Platelets: 355 10*3/uL (ref 150–450)
RBC: 4.45 x10E6/uL (ref 3.77–5.28)
RDW: 14.6 % (ref 11.7–15.4)
WBC: 8.1 10*3/uL (ref 3.4–10.8)

## 2021-10-29 LAB — COMPREHENSIVE METABOLIC PANEL
ALT: 9 IU/L (ref 0–32)
AST: 14 IU/L (ref 0–40)
Albumin/Globulin Ratio: 1.4 (ref 1.2–2.2)
Albumin: 4.2 g/dL (ref 3.8–4.9)
Alkaline Phosphatase: 75 IU/L (ref 44–121)
BUN/Creatinine Ratio: 18 (ref 9–23)
BUN: 12 mg/dL (ref 6–24)
Bilirubin Total: 0.2 mg/dL (ref 0.0–1.2)
CO2: 24 mmol/L (ref 20–29)
Calcium: 9.5 mg/dL (ref 8.7–10.2)
Chloride: 103 mmol/L (ref 96–106)
Creatinine, Ser: 0.65 mg/dL (ref 0.57–1.00)
Globulin, Total: 3.1 g/dL (ref 1.5–4.5)
Glucose: 84 mg/dL (ref 70–99)
Potassium: 4.3 mmol/L (ref 3.5–5.2)
Sodium: 141 mmol/L (ref 134–144)
Total Protein: 7.3 g/dL (ref 6.0–8.5)
eGFR: 103 mL/min/{1.73_m2} (ref >59)

## 2021-10-29 LAB — MICROALBUMIN / CREATININE URINE RATIO
Creatinine, Urine: 60.1 mg/dL
Microalb/Creat Ratio: <5 mg/g creat (ref 0–29)
Microalbumin, Urine: <3 ug/mL

## 2021-10-29 LAB — LIPID PANEL
Chol/HDL Ratio: 5.9 ratio — ABNORMAL HIGH (ref 0.0–4.4)
Cholesterol, Total: 259 mg/dL — ABNORMAL HIGH (ref 100–199)
HDL: 44 mg/dL (ref >39)
LDL Chol Calc (NIH): 198 mg/dL — ABNORMAL HIGH (ref 0–99)
Triglycerides: 96 mg/dL (ref 0–149)
VLDL Cholesterol Cal: 17 mg/dL (ref 5–40)

## 2021-10-29 LAB — TSH: TSH: 2.04 u[IU]/mL (ref 0.450–4.500)

## 2021-11-28 ENCOUNTER — Other Ambulatory Visit: Payer: Self-pay

## 2021-11-28 DIAGNOSIS — E785 Hyperlipidemia, unspecified: Secondary | ICD-10-CM

## 2021-11-28 MED ORDER — ROSUVASTATIN CALCIUM 40 MG PO TABS: 40.0000 mg | ORAL_TABLET | Freq: Every day | ORAL | 0 refills | Status: DC

## 2021-11-28 NOTE — Telephone Encounter (Signed)
Copied from CRM [id number]. Topic: Quick Communication - Rx Refill/Question >> Nov 27, 2021  3:57 PM Aretta Nip wrote: Reason for CRM: rosuvastatin (CRESTOR) 40 MG tablet 90 tablet 1 04/21/2021   Sig - Route: Take 1 tablet (40 mg total) by mouth daily. In placed of Atorvastatin - Oral  Patient not taking: Reported on 10/27/2021      Sent to pharmacy as: rosuvastatin (CRESTOR) 40 MG table  Has the patient contacted their pharmacy? Yes.   (Agent: If no, request that the patient contact the pharmacy for the refill. If patient does not wish to contact the pharmacy document the reason why and proceed with request.) (Agent: If yes, when and what did the pharmacy advise?) Pt needs to call local  Preferred Pharmacy (with phone number or street name): [facility][id number] Nicholes Rough, [place] - [address] AT [facility] [address] [place] [address] Phone: [phone] Fax: [phone]   Has the patient been seen for an appointment in the last year OR does the patient have an upcoming appointment? Yes.    Agent: Please be advised that RX refills may take up to 3 business days. We ask that you follow-up with your pharmacy.

## 2021-11-28 NOTE — Telephone Encounter (Signed)
Pt called and is requesting for this prescription to be sent to [facility] on [address].

## 2021-11-28 NOTE — Addendum Note (Signed)
Addended by: Elby Beck F on: 11/28/2021 02:41 PM   Modules accepted: Orders

## 2021-11-29 NOTE — Telephone Encounter (Signed)
Called [facility] and advised Teacher, English as a foreign language that pt want medication moved from [facility] to [place]. Phone number of [facility] provided.  Requested Prescriptions  Refused Prescriptions Disp Refills   rosuvastatin (CRESTOR) 40 MG tablet 90 tablet 0    Sig: Take 1 tablet (40 mg total) by mouth daily.     Cardiovascular:  Antilipid - Statins Failed - 11/28/2021  2:41 PM      Failed - Total Cholesterol in normal range and within 360 days    Cholesterol, Total  Date Value Ref Range Status  10/27/2021 259 (H) 100 - 199 mg/dL Final          Failed - LDL in normal range and within 360 days    LDL Cholesterol (Calc)  Date Value Ref Range Status  12/04/2019 127 (H) mg/dL (calc) Final    Comment:    Reference range: <100 . Desirable range <100 mg/dL for primary prevention;   <70 mg/dL for patients with CHD or diabetic patients  with > or = 2 CHD risk factors. Marland Kitchen LDL-C is now calculated using the Martin-Hopkins  calculation, which is a validated novel method providing  better accuracy than the Friedewald equation in the  estimation of LDL-C.  Horald Pollen et al. Lenox Ahr. 3546;568(12): 2061-2068  (http://education.Quest[employer].com/faq/FAQ164)    LDL Chol Calc (NIH)  Date Value Ref Range Status  10/27/2021 198 (H) 0 - 99 mg/dL Final          Passed - HDL in normal range and within 360 days    HDL  Date Value Ref Range Status  10/27/2021 44 >39 mg/dL Final          Passed - Triglycerides in normal range and within 360 days    Triglycerides  Date Value Ref Range Status  10/27/2021 96 0 - 149 mg/dL Final          Passed - Patient is not pregnant      Passed - Valid encounter within last 12 months    Recent Outpatient Visits           1 month ago Diabetes mellitus type 2 in obese [facility])   [facility] [place], Danna Hefty, MD   4 months ago Diabetes mellitus type 2 in obese [facility])   [facility] Alba Cory, MD   5 months ago Allergic  contact dermatitis, unspecified trigger   [facility] Gabriel Cirri, NP   7 months ago Morbid obesity [facility])   [facility] Alba Cory, MD   12 months ago Well adult exam   [facility] Alba Cory, MD       Future Appointments             In 2 months Carlynn Purl, Danna Hefty, MD [facility], [facility]

## 2022-01-19 ENCOUNTER — Other Ambulatory Visit: Payer: Self-pay | Admitting: Family Medicine

## 2022-01-19 DIAGNOSIS — E039 Hypothyroidism, unspecified: Secondary | ICD-10-CM

## 2022-01-19 MED ORDER — LEVOTHYROXINE SODIUM 50 MCG PO TABS: 50.0000 ug | ORAL_TABLET | Freq: Every day | ORAL | 0 refills | Status: DC

## 2022-01-19 NOTE — Telephone Encounter (Signed)
Requested Prescriptions  Pending Prescriptions Disp Refills   levothyroxine (SYNTHROID) 50 MCG tablet 90 tablet 0    Sig: Take 1 tablet (50 mcg total) by mouth daily before breakfast.     Endocrinology:  Hypothyroid Agents Passed - 01/19/2022  1:56 PM      Passed - TSH in normal range and within 360 days    TSH  Date Value Ref Range Status  10/27/2021 2.040 0.450 - 4.500 uIU/mL Final         Passed - Valid encounter within last 12 months    Recent Outpatient Visits          2 months ago Diabetes mellitus type 2 in obese [facility])   [facility] [place] [place], Drue Stager, MD   5 months ago Diabetes mellitus type 2 in obese [facility])   [facility] Steele Sizer, MD   6 months ago Allergic contact dermatitis, unspecified trigger   [place], NP   9 months ago Morbid obesity [facility])   [facility] Steele Sizer, MD   1 year ago Well adult exam   [facility] Steele Sizer, MD      Future Appointments            In 1 week Steele Sizer, MD [facility], [facility]

## 2022-01-19 NOTE — Telephone Encounter (Signed)
Copied from CRM [id number]. Topic: Quick Communication - Rx Refill/Question >> Jan 19, 2022  1:00 PM Gaetana Michaelis A wrote: Medication: levothyroxine (SYNTHROID) 50 MCG tablet [[id number]]   Has the patient contacted their pharmacy? Yes.  The patient has been directed to contact their PCP (Agent: If no, request that the patient contact the pharmacy for the refill. If patient does not wish to contact the pharmacy document the reason why and proceed with request.) (Agent: If yes, when and what did the pharmacy advise?)  Preferred Pharmacy (with phone number or street name): [facility] #[id number] Nicholes Rough, [place] - [address] AT [facility] [address] [place] [address] Phone: [phone] Fax: [phone]  Has the patient been seen for an appointment in the last year OR does the patient have an upcoming appointment? Yes.    Agent: Please be advised that RX refills may take up to 3 business days. We ask that you follow-up with your pharmacy.

## 2022-01-27 NOTE — Progress Notes (Signed)
Name: Sherry Patel   MRN: [id number]    DOB: May 15, 1966   Date:01/28/2022       Progress Note  Subjective  Chief Complaint  Follow Up  HPI  DMII: last A1C in Dec 21 was 6.7 % down to  6.3 %, 6.1 % and last visit it was 5.9 %  She denies polyphagia, polydipsia or polyuria. She has a history of dyslipidemia, obesity and microalbuminuria. She cannot take ACE because of history of angioedema, unable to tolerate Jardiance caused sore throat, she has been on Rybelsus since Summer 2022 and is doing well. She states medication curbs her appetite . Last LDL was very high and she has been taking Rosuvastatin 40 mg daily and we will recheck labs today   Hypothyroidism: she has been taking levothyroxine 50 mcg daily, last TSH was normal  Has had palpitations intermittently for many years - they occur when she's stressed but it is sporadic now - controlled with metoprolol .  She denies dysphagia, dry skin or change in bowel movement.   HTN: BP has been under control, she does not check bp at home. She is taking medication as prescribed , no side effects. She has CKI, but cannot take ACE/ARB due to history of angioedema, unable to tolerate Jardiance. We will continue Norvasc and Metoprolol     CKD: Had elevated microalbumin in May 2019; kidney function has been stable, denies pruritis. We tried adding Jardiance on her last visit but unable to tolerate, she states it causes sore throat, she stopped and tried again a couple of weeks later and it happened again We are keeping diabetes and bp under control    Hyperlipidemia: Last LDL was 127, down to 106  up to 198 . She had stopped taking Rosuvastatin , she has been complaint since Nov and we will recheck labs today     Obesity: She states since menopause she had  craving carbohydrates. She states weight used to be around 175 lbs but gradually gained weight over the past few years, she was 205 in May 2022 started on Rybelsus and she has lost 23 lbs since started  on medication , today her weight is 182 lbs    Patient Active Problem List   Diagnosis Date Noted   Diabetes mellitus type 2 in obese (HCC) 07/25/2021   Prediabetes 12/04/2019   Pure hypercholesterolemia 12/04/2019   Microalbuminuria 05/23/2019   Acquired hypothyroidism 12/31/2015   IFG (impaired fasting glucose) 09/25/2015   Hypertensive chronic kidney disease 09/25/2015   Morbid obesity due to excess calories (HCC) 09/25/2015   Menopause 09/25/2015    Past Surgical History:  Procedure Laterality Date   breast biopy Left 20 years ago   BREAST EXCISIONAL BIOPSY Left    cyst removed    Family History  Problem Relation Age of Onset   Hypertension Mother    Hypertension Maternal Grandmother    Hypertension Maternal Grandfather     Social History   Tobacco Use   Smoking status: Former    Packs/day: 1.00    Years: 20.00    Pack years: 20.00    Types: Cigarettes   Smokeless tobacco: Never   Tobacco comments:    Quit around 1999  Substance Use Topics   Alcohol use: No     Current Outpatient Medications:    amLODipine (NORVASC) 5 MG tablet, Take 1 tablet (5 mg total) by mouth daily., Disp: 90 tablet, Rfl: 1   Ascorbic Acid (VITAMIN C) 100 MG tablet, Take  100 mg by mouth daily., Disp: , Rfl:    hydrochlorothiazide (HYDRODIURIL) 12.5 MG tablet, Take 1 tablet (12.5 mg total) by mouth daily., Disp: 90 tablet, Rfl: 1   levothyroxine (SYNTHROID) 50 MCG tablet, Take 1 tablet (50 mcg total) by mouth daily before breakfast., Disp: 90 tablet, Rfl: 0   metoprolol succinate (TOPROL-XL) 100 MG 24 hr tablet, Take 1 tablet (100 mg total) by mouth daily. Take with or immediately following a meal., Disp: 90 tablet, Rfl: 1   Multiple Vitamins-Minerals (MULTIVITAMIN WITH MINERALS) tablet, Take 1 tablet by mouth daily., Disp: , Rfl:    Omega-3 Fatty Acids (FISH OIL) 1000 MG CAPS, Take by mouth., Disp: , Rfl:    rosuvastatin (CRESTOR) 40 MG tablet, Take 1 tablet (40 mg total) by mouth daily.,  Disp: 90 tablet, Rfl: 0   Semaglutide (RYBELSUS) 7 MG TABS, Take 7 mg by mouth daily., Disp: 90 tablet, Rfl: 0  Allergies  Allergen Reactions   Lisinopril Swelling    Angioedema in 2018    I personally reviewed active problem list, medication list, allergies, family history, social history with the patient/caregiver today.   ROS  Constitutional: Negative for fever or weight change.  Respiratory: Negative for cough and shortness of breath.   Cardiovascular: Negative for chest pain or palpitations.  Gastrointestinal: Negative for abdominal pain, no bowel changes.  Musculoskeletal: Negative for gait problem or joint swelling.  Skin: Negative for rash.  Neurological: Negative for dizziness or headache.  No other specific complaints in a complete review of systems (except as listed in HPI above).   Objective  Vitals:   01/28/22 0745  BP: 128/84  Pulse: 87  Resp: 16  SpO2: 98%  Weight: 182 lb (82.6 kg)  Height: 5\' 2"  (1.575 m)    Body mass index is 33.29 kg/m.  Physical Exam  Constitutional: Patient appears well-developed and well-nourished. Obese  No distress.  HEENT: head atraumatic, normocephalic, pupils equal and reactive to light, neck supple Cardiovascular: Normal rate, regular rhythm and normal heart sounds.  No murmur heard. No BLE edema. Pulmonary/Chest: Effort normal and breath sounds normal. No respiratory distress. Abdominal: Soft.  There is no tenderness. Psychiatric: Patient has a normal mood and affect. behavior is normal. Judgment and thought content normal.    PHQ2/9: Depression screen [facility] 2/9 01/28/2022 10/27/2021 07/25/2021 06/25/2021 04/21/2021  Decreased Interest 0 0 0 0 0  Down, Depressed, Hopeless 0 0 0 0 0  PHQ - 2 Score 0 0 0 0 0  Altered sleeping 0 0 - - -  Tired, decreased energy 0 0 - - -  Change in appetite 0 0 - - -  Feeling bad or failure about yourself  0 0 - - -  Trouble concentrating 0 0 - - -  Moving slowly or fidgety/restless 0 0 - -  -  Suicidal thoughts 0 0 - - -  PHQ-9 Score 0 0 - - -  Difficult doing work/chores - - - - -  Some recent data might be hidden    phq 9 is negative   Fall Risk: Fall Risk  01/28/2022 10/27/2021 07/25/2021 06/25/2021 04/21/2021  Falls in the past year? 0 0 0 0 0  Number falls in past yr: 0 0 0 0 0  Injury with Fall? 0 0 0 0 0  Risk for fall due to : No Fall Risks No Fall Risks No Fall Risks - -  Follow up Falls prevention discussed Falls prevention discussed Falls prevention discussed Falls evaluation  completed -      Functional Status Survey: Is the patient deaf or have difficulty hearing?: No Does the patient have difficulty seeing, even when wearing glasses/contacts?: No Does the patient have difficulty concentrating, remembering, or making decisions?: No Does the patient have difficulty walking or climbing stairs?: No Does the patient have difficulty dressing or bathing?: No Does the patient have difficulty doing errands alone such as visiting a doctor's office or shopping?: No    Assessment & Plan  1. Hypertension associated with chronic kidney disease due to type 2 diabetes mellitus (HCC)  - HgB A1c  2. Obesity    3. Acquired hypothyroidism  - levothyroxine (SYNTHROID) 50 MCG tablet; Take 1 tablet (50 mcg total) by mouth daily before breakfast.  Dispense: 90 tablet; Refill: 1  4. Benign hypertension with chronic kidney disease, stage I   5. Dyslipidemia  - Lipid panel - COMPLETE METABOLIC PANEL WITH GFR - rosuvastatin (CRESTOR) 40 MG tablet; Take 1 tablet (40 mg total) by mouth daily.  Dispense: 90 tablet; Refill: 1  6. Dyslipidemia associated with type 2 diabetes mellitus (HCC)  - Lipid panel - COMPLETE METABOLIC PANEL WITH GFR  7. Need for shingles vaccine  She will check coverage with her insurance   8. Diabetes mellitus type 2 in obese (HCC)  - Semaglutide (RYBELSUS) 7 MG TABS; Take 7 mg by mouth daily.  Dispense: 90 tablet; Refill: 1

## 2022-01-28 ENCOUNTER — Ambulatory Visit: Payer: Managed Care, Other (non HMO) | Admitting: Family Medicine

## 2022-01-28 ENCOUNTER — Other Ambulatory Visit: Payer: Self-pay

## 2022-01-28 ENCOUNTER — Encounter: Payer: Self-pay | Admitting: Family Medicine

## 2022-01-28 VITALS — BP 128/84 | HR 87 | Resp 16 | Ht 62.0 in | Wt 182.0 lb

## 2022-01-28 DIAGNOSIS — E039 Hypothyroidism, unspecified: Secondary | ICD-10-CM

## 2022-01-28 DIAGNOSIS — I129 Hypertensive chronic kidney disease with stage 1 through stage 4 chronic kidney disease, or unspecified chronic kidney disease: Secondary | ICD-10-CM

## 2022-01-28 DIAGNOSIS — E669 Obesity, unspecified: Secondary | ICD-10-CM | POA: Diagnosis not present

## 2022-01-28 DIAGNOSIS — E1169 Type 2 diabetes mellitus with other specified complication: Secondary | ICD-10-CM

## 2022-01-28 DIAGNOSIS — E785 Hyperlipidemia, unspecified: Secondary | ICD-10-CM

## 2022-01-28 DIAGNOSIS — E1122 Type 2 diabetes mellitus with diabetic chronic kidney disease: Secondary | ICD-10-CM | POA: Diagnosis not present

## 2022-01-28 DIAGNOSIS — Z23 Encounter for immunization: Secondary | ICD-10-CM

## 2022-01-28 DIAGNOSIS — N181 Chronic kidney disease, stage 1: Secondary | ICD-10-CM

## 2022-01-28 MED ORDER — LEVOTHYROXINE SODIUM 50 MCG PO TABS: 50.0000 ug | ORAL_TABLET | Freq: Every day | ORAL | 1 refills | Status: DC

## 2022-01-28 MED ORDER — RYBELSUS 7 MG PO TABS: 7.0000 mg | ORAL_TABLET | Freq: Every day | ORAL | 1 refills | Status: DC

## 2022-01-28 MED ORDER — ROSUVASTATIN CALCIUM 40 MG PO TABS: 40.0000 mg | ORAL_TABLET | Freq: Every day | ORAL | 1 refills | Status: DC

## 2022-01-29 LAB — COMPLETE METABOLIC PANEL WITH GFR
AG Ratio: 1.3 (calc) (ref 1.0–2.5)
ALT: 12 U/L (ref 6–29)
AST: 14 U/L (ref 10–35)
Albumin: 4.3 g/dL (ref 3.6–5.1)
Alkaline phosphatase (APISO): 61 U/L (ref 37–153)
BUN: 17 mg/dL (ref 7–25)
CO2: 30 mmol/L (ref 20–32)
Calcium: 9.5 mg/dL (ref 8.6–10.4)
Chloride: 106 mmol/L (ref 98–110)
Creat: 0.66 mg/dL (ref 0.50–1.03)
Globulin: 3.2 g/dL (calc) (ref 1.9–3.7)
Glucose, Bld: 86 mg/dL (ref 65–99)
Potassium: 5.2 mmol/L (ref 3.5–5.3)
Sodium: 141 mmol/L (ref 135–146)
Total Bilirubin: 0.3 mg/dL (ref 0.2–1.2)
Total Protein: 7.5 g/dL (ref 6.1–8.1)
eGFR: 102 mL/min/{1.73_m2} (ref >=60)

## 2022-01-29 LAB — LIPID PANEL
Cholesterol: 132 mg/dL (ref <200)
HDL: 39 mg/dL — ABNORMAL LOW (ref >=50)
LDL Cholesterol (Calc): 78 mg/dL (calc)
Non-HDL Cholesterol (Calc): 93 mg/dL (calc) (ref <130)
Total CHOL/HDL Ratio: 3.4 (calc) (ref <5.0)
Triglycerides: 70 mg/dL (ref <150)

## 2022-01-29 LAB — HEMOGLOBIN A1C
Hgb A1c MFr Bld: 6.1 % of total Hgb — ABNORMAL HIGH (ref <5.7)
Mean Plasma Glucose: 128 mg/dL
eAG (mmol/L): 7.1 mmol/L

## 2022-03-23 LAB — HM DIABETES EYE EXAM

## 2022-03-25 ENCOUNTER — Other Ambulatory Visit: Payer: Self-pay | Admitting: Family Medicine

## 2022-03-25 DIAGNOSIS — E1169 Type 2 diabetes mellitus with other specified complication: Secondary | ICD-10-CM

## 2022-04-06 ENCOUNTER — Other Ambulatory Visit: Payer: Self-pay | Admitting: Family Medicine

## 2022-04-06 DIAGNOSIS — I129 Hypertensive chronic kidney disease with stage 1 through stage 4 chronic kidney disease, or unspecified chronic kidney disease: Secondary | ICD-10-CM

## 2022-04-06 NOTE — Telephone Encounter (Signed)
Copied from CRM [id number]. Topic: General - Other ?>> Apr 06, 2022  4:54 PM Gaetana Michaelis A wrote: ?Reason for CRM: Medication Refill - Medication: hydrochlorothiazide (HYDRODIURIL) 12.5 MG tablet [[id number]]  ? ?Has the patient contacted their pharmacy? No. The patient was uncertain of the medication's status    ?(Agent: If no, request that the patient contact the pharmacy for the refill. If patient does not wish to contact the pharmacy document the reason why and proceed with request.) ?(Agent: If yes, when and what did the pharmacy advise?) ? ?Preferred Pharmacy (with phone number or street name): [facility][id number] Nicholes Rough, [place] - [address] AT [facility] OF [address] ?[address] [place] [address] ?Phone: [phone] Fax: [phone] ?Hours: Not open 24 hours ? ?Has the patient been seen for an appointment in the last year OR does the patient have an upcoming appointment? Yes.   ? ?Agent: Please be advised that RX refills may take up to 3 business days. We ask that you follow-up with your pharmacy. ?

## 2022-04-08 MED ORDER — HYDROCHLOROTHIAZIDE 12.5 MG PO TABS: 12.5000 mg | ORAL_TABLET | Freq: Every day | ORAL | 0 refills | Status: DC

## 2022-04-08 NOTE — Telephone Encounter (Signed)
Requested Prescriptions  ?Pending Prescriptions Disp Refills  ?? hydrochlorothiazide (HYDRODIURIL) 12.5 MG tablet 90 tablet 1  ?  Sig: Take 1 tablet (12.5 mg total) by mouth daily.  ?  ? Cardiovascular: Diuretics - Thiazide Passed - 04/06/2022  5:53 PM  ?  ?  Passed - Cr in normal range and within 180 days  ?  Creat  ?Date Value Ref Range Status  ?01/28/2022 0.66 0.50 - 1.03 mg/dL Final  ? ?Creatinine, Urine  ?Date Value Ref Range Status  ?07/12/2020 37 20 - 275 mg/dL Final  ?   ?  ?  Passed - K in normal range and within 180 days  ?  Potassium  ?Date Value Ref Range Status  ?01/28/2022 5.2 3.5 - 5.3 mmol/L Final  ?   ?  ?  Passed - Na in normal range and within 180 days  ?  Sodium  ?Date Value Ref Range Status  ?01/28/2022 141 135 - 146 mmol/L Final  ?10/27/2021 141 134 - 144 mmol/L Final  ?   ?  ?  Passed - Last BP in normal range  ?  BP Readings from Last 1 Encounters:  ?01/28/22 128/84  ?   ?  ?  Passed - Valid encounter within last 6 months  ?  Recent Outpatient Visits   ?      ? 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 [facility] [place], Danna Hefty, MD  ? 5 months ago Diabetes mellitus type 2 in obese [facility])  ? [facility] [place], Danna Hefty, MD  ? 8 months ago Diabetes mellitus type 2 in obese [facility])  ? [facility] [place], Danna Hefty, MD  ? 9 months ago Allergic contact dermatitis, unspecified trigger  ? [facility] [place], Elnita Maxwell, NP  ? 11 months ago Morbid obesity [facility])  ? [facility] Alba Cory, MD  ?  ?  ?Future Appointments   ?        ? In 3 months Alba Cory, MD [facility]  ?  ? ?  ?  ?  ? ? ?

## 2022-04-22 ENCOUNTER — Other Ambulatory Visit: Payer: Self-pay | Admitting: Family Medicine

## 2022-04-22 DIAGNOSIS — E039 Hypothyroidism, unspecified: Secondary | ICD-10-CM

## 2022-06-09 ENCOUNTER — Other Ambulatory Visit: Payer: Self-pay | Admitting: Family Medicine

## 2022-06-09 DIAGNOSIS — Z1231 Encounter for screening mammogram for malignant neoplasm of breast: Secondary | ICD-10-CM

## 2022-07-02 ENCOUNTER — Ambulatory Visit
Admission: RE | Admit: 2022-07-02 | Discharge: 2022-07-02 | Disposition: A | Discharge status: Home / Self Care | Payer: Managed Care, Other (non HMO) | Source: Ambulatory Visit | Attending: Family Medicine | Admitting: Family Medicine

## 2022-07-02 DIAGNOSIS — Z1231 Encounter for screening mammogram for malignant neoplasm of breast: Secondary | ICD-10-CM | POA: Insufficient documentation

## 2022-07-06 ENCOUNTER — Other Ambulatory Visit: Payer: Self-pay | Admitting: Family Medicine

## 2022-07-06 DIAGNOSIS — I129 Hypertensive chronic kidney disease with stage 1 through stage 4 chronic kidney disease, or unspecified chronic kidney disease: Secondary | ICD-10-CM

## 2022-07-07 ENCOUNTER — Ambulatory Visit: Payer: Managed Care, Other (non HMO) | Admitting: Family Medicine

## 2022-07-21 ENCOUNTER — Other Ambulatory Visit: Payer: Self-pay | Admitting: Family Medicine

## 2022-07-21 DIAGNOSIS — I129 Hypertensive chronic kidney disease with stage 1 through stage 4 chronic kidney disease, or unspecified chronic kidney disease: Secondary | ICD-10-CM

## 2022-07-21 DIAGNOSIS — E1169 Type 2 diabetes mellitus with other specified complication: Secondary | ICD-10-CM

## 2022-07-21 MED ORDER — AMLODIPINE BESYLATE 5 MG PO TABS: 5.0000 mg | ORAL_TABLET | Freq: Every day | ORAL | 0 refills | Status: DC

## 2022-07-21 NOTE — Telephone Encounter (Unsigned)
Copied from CRM [id number]. Topic: General - Other >> Jul 21, 2022 12:15 PM Everette C wrote: Reason for CRM: Medication Refill - Medication: amLODipine (NORVASC) 5 MG tablet [[id number]]   Semaglutide (RYBELSUS) 7 MG TABS [[id number]]   Has the patient contacted their pharmacy? Yes.  The patient has been directed to contact their PCP  (Agent: If no, request that the patient contact the pharmacy for the refill. If patient does not wish to contact the pharmacy document the reason why and proceed with request.) (Agent: If yes, when and what did the pharmacy advise?)  Preferred Pharmacy (with phone number or street name): [facility] #[id number] Nicholes Rough, [place] - [address] AT [facility] OF [address] [place] [address] Phone: [phone] Fax: [phone] Hours: Not open 24 hours   Has the patient been seen for an appointment in the last year OR does the patient have an upcoming appointment? Yes.    Agent: Please be advised that RX refills may take up to 3 business days. We ask that you follow-up with your pharmacy.

## 2022-07-21 NOTE — Telephone Encounter (Signed)
Requested medication (s) are due for refill today: yes  Requested medication (s) are on the active medication list: yes  Last refill:  01/28/22 #90 with 1 RF  Future visit scheduled: 08/05/22  Notes to clinic:  This med does not have a protocol to follow, please assess.      Requested Prescriptions  Pending Prescriptions Disp Refills   Semaglutide (RYBELSUS) 7 MG TABS 90 tablet 1    Sig: Take 7 mg by mouth daily.     Off-Protocol Failed - 07/21/2022 12:34 PM      Failed - Medication not assigned to a protocol, review manually.      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 [facility])   [facility] [place], Danna Hefty, MD   8 months ago Diabetes mellitus type 2 in obese [facility])   [facility] [place], Danna Hefty, MD   12 months ago Diabetes mellitus type 2 in obese [facility])   [facility] Alba Cory, MD   1 year ago Allergic contact dermatitis, unspecified trigger   [facility] Gabriel Cirri, NP   1 year ago Morbid obesity [facility])   [facility] Alba Cory, MD       Future Appointments             In 2 weeks Alba Cory, MD [facility]            Signed Prescriptions Disp Refills   amLODipine (NORVASC) 5 MG tablet 90 tablet 0    Sig: Take 1 tablet (5 mg total) by mouth daily.     Cardiovascular: Calcium Channel Blockers 2 Passed - 07/21/2022 12:34 PM      Passed - Last BP in normal range    BP Readings from Last 1 Encounters:  01/28/22 128/84         Passed - Last Heart Rate in normal range    Pulse Readings from Last 1 Encounters:  01/28/22 87         Passed - Valid encounter within last 6 months    Recent Outpatient Visits           5 months ago Hypertension associated with chronic kidney disease due  to type 2 diabetes mellitus [facility])   [facility] [place], Danna Hefty, MD   8 months ago Diabetes mellitus type 2 in obese [facility])   [facility] [place], Danna Hefty, MD   12 months ago Diabetes mellitus type 2 in obese [facility])   [facility] Alba Cory, MD   1 year ago Allergic contact dermatitis, unspecified trigger   [facility] Gabriel Cirri, NP   1 year ago Morbid obesity [facility])   [facility] Alba Cory, MD       Future Appointments             In 2 weeks Alba Cory, MD [facility]

## 2022-07-21 NOTE — Telephone Encounter (Signed)
Requested Prescriptions  Pending Prescriptions Disp Refills  . amLODipine (NORVASC) 5 MG tablet 90 tablet 0    Sig: Take 1 tablet (5 mg total) by mouth daily.     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 Gabriel Cirri, NP   1 year ago Morbid obesity [facility])   [facility] Alba Cory, MD      Future Appointments            In 2 weeks Alba Cory, MD [facility]           . Semaglutide (RYBELSUS) 7 MG TABS 90 tablet 1    Sig: Take 7 mg by mouth daily.     Off-Protocol Failed - 07/21/2022 12:34 PM      Failed - Medication not assigned to a protocol, review manually.      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 [facility])   [facility] [place], Danna Hefty, MD   8 months ago Diabetes mellitus type 2 in obese [facility])   [facility] Alba Cory, MD   12 months ago Diabetes mellitus type 2 in obese [facility])   [facility] Alba Cory, MD   1 year ago  Allergic contact dermatitis, unspecified trigger   [facility] Gabriel Cirri, NP   1 year ago Morbid obesity [facility])   [facility] Alba Cory, MD      Future Appointments            In 2 weeks Alba Cory, MD [facility], [facility]

## 2022-07-22 MED ORDER — RYBELSUS 7 MG PO TABS: 7.0000 mg | ORAL_TABLET | Freq: Every day | ORAL | 0 refills | Status: DC

## 2022-07-27 ENCOUNTER — Other Ambulatory Visit: Payer: Self-pay | Admitting: Family Medicine

## 2022-07-27 DIAGNOSIS — E785 Hyperlipidemia, unspecified: Secondary | ICD-10-CM

## 2022-08-04 NOTE — Progress Notes (Unsigned)
Name: Sherry Patel   MRN: [id number]    DOB: 09-07-1966   Date:08/05/2022       Progress Note  Subjective  Chief Complaint  Follow Up  HPI  DMII: last A1C in Dec 21 was 6.7 % down to  6.3 %, 6.1 % and last visit it was 5.9 %  She denies polyphagia, polydipsia or polyuria. She has a history of dyslipidemia, obesity and microalbuminuria. She cannot take ACE because of history of angioedema, unable to tolerate Jardiance caused sore throat, she has been on Rybelsus since Summer 2022 and is doing well. She states medication curbs her appetite, she does not want to go up on the dose  . Last LDL was very high and she has been taking Rosuvastatin 40 mg daily,  Hypothyroidism: she has been taking levothyroxine 50 mcg daily, last TSH was normal  Has had palpitations intermittently for many years - they occur when she's stressed but it is sporadic now - controlled with metoprolol .  She denies dysphagia, dry skin or change in bowel movement at this time    HTN: BP has been under control She is taking medication as prescribed , no side effects. She has CKI, but cannot take ACE/ARB due to history of angioedema, unable to tolerate Jardiance. We will continue Norvasc and Metoprolol  since she is tolerating medications well    CKD: Had elevated microalbumin in May 2019; kidney function has been stable, denies pruritis. We tried adding Jardiance on her last visit but unable to tolerate, she states it causes sore throat, she stopped and tried again a couple of weeks later and it happened again We are keeping diabetes and bp under control and we will continue to monitor it     Hyperlipidemia: she is back on Crestor and we will recheck labs    Obesity: She states since menopause she had  craving carbohydrates. She states weight used to be around 175 lbs but gradually gained weight over the past few years, she was 205 in May 2022 started on Rybelsus and she has lost 29 lbs , today's weight is 176 lbs, at this time  she does not want to go up on dose of Rybelsus   Rhinorrhea: she refused to get covid tested, advised to wear a mask, denies fatigue, sore throat, headaches. She is taking otc medications for now    Patient Active Problem List   Diagnosis Date Noted   Diabetes mellitus type 2 in obese (HCC) 07/25/2021   Prediabetes 12/04/2019   Pure hypercholesterolemia 12/04/2019   Microalbuminuria 05/23/2019   Acquired hypothyroidism 12/31/2015   IFG (impaired fasting glucose) 09/25/2015   Hypertensive chronic kidney disease 09/25/2015   Menopause 09/25/2015    Past Surgical History:  Procedure Laterality Date   breast biopy Left 20 years ago   BREAST EXCISIONAL BIOPSY Left    cyst removed    Family History  Problem Relation Age of Onset   Hypertension Mother    Hypertension Maternal Grandmother    Hypertension Maternal Grandfather     Social History   Tobacco Use   Smoking status: Former    Packs/day: 1.00    Years: 20.00    Total pack years: 20.00    Types: Cigarettes   Smokeless tobacco: Never   Tobacco comments:    Quit around 1999  Substance Use Topics   Alcohol use: No     Current Outpatient Medications:    Ascorbic Acid (VITAMIN C) 100 MG tablet, Take 100  mg by mouth daily., Disp: , Rfl:    levothyroxine (SYNTHROID) 50 MCG tablet, TAKE 1 TABLET(50 MCG) BY MOUTH DAILY BEFORE BREAKFAST, Disp: 90 tablet, Rfl: 0   Multiple Vitamins-Minerals (MULTIVITAMIN WITH MINERALS) tablet, Take 1 tablet by mouth daily., Disp: , Rfl:    Omega-3 Fatty Acids (FISH OIL) 1000 MG CAPS, Take by mouth., Disp: , Rfl:    rosuvastatin (CRESTOR) 40 MG tablet, TAKE 1 TABLET(40 MG) BY MOUTH DAILY, Disp: 90 tablet, Rfl: 1   Zoster Vaccine Adjuvanted (SHINGRIX) injection, Inject 0.5 mLs into the muscle once for 1 dose., Disp: 0.5 mL, Rfl: 0   amLODipine (NORVASC) 5 MG tablet, Take 1 tablet (5 mg total) by mouth daily., Disp: 90 tablet, Rfl: 0   hydrochlorothiazide (HYDRODIURIL) 12.5 MG tablet, Take 1  tablet (12.5 mg total) by mouth daily., Disp: 90 tablet, Rfl: 1   metoprolol succinate (TOPROL-XL) 100 MG 24 hr tablet, Take 1 tablet (100 mg total) by mouth daily. Take with or immediately following a meal., Disp: 90 tablet, Rfl: 1   Semaglutide (RYBELSUS) 7 MG TABS, Take 7 mg by mouth daily., Disp: 90 tablet, Rfl: 1  Allergies  Allergen Reactions   Lisinopril Swelling    Angioedema in 2018    I personally reviewed active problem list, medication list, allergies, family history, social history, health maintenance with the patient/caregiver today.   ROS  Ten systems reviewed and is negative except as mentioned in HPI   Objective  Vitals:   08/05/22 1107  BP: 122/76  Pulse: 90  Resp: 16  SpO2: 97%  Weight: 176 lb (79.8 kg)  Height: 5\' 2"  (1.575 m)    Body mass index is 32.19 kg/m.  Physical Exam  Constitutional: Patient appears well-developed and well-nourished. Obese  No distress.  HEENT: head atraumatic, normocephalic, pupils equal and reactive to light,, neck supple, throat within normal limits, nose: clear rhinorrhea  Cardiovascular: Normal rate, regular rhythm and normal heart sounds.  No murmur heard. Trace  BLE edema. Pulmonary/Chest: Effort normal and breath sounds normal. No respiratory distress. Abdominal: Soft.  There is no tenderness. Psychiatric: Patient has a normal mood and affect. behavior is normal. Judgment and thought content normal.   Recent Results (from the past 2160 hour(s))  POCT HgB A1C     Status: None   Collection Time: 08/05/22 11:17 AM  Result Value Ref Range   Hemoglobin A1C 5.6 4.0 - 5.6 %   HbA1c POC (<> result, manual entry)     HbA1c, POC (prediabetic range)     HbA1c, POC (controlled diabetic range)       PHQ2/9:    08/05/2022   11:16 AM 01/28/2022    7:45 AM 10/27/2021    7:33 AM 07/25/2021    7:46 AM 06/25/2021   10:30 AM  Depression screen PHQ 2/9  Decreased Interest 0 0 0 0 0  Down, Depressed, Hopeless 0 0 0 0 0  PHQ - 2  Score 0 0 0 0 0  Altered sleeping 0 0 0    Tired, decreased energy 0 0 0    Change in appetite 0 0 0    Feeling bad or failure about yourself  0 0 0    Trouble concentrating 0 0 0    Moving slowly or fidgety/restless 0 0 0    Suicidal thoughts 0 0 0    PHQ-9 Score 0 0 0      phq 9 is negative   Fall Risk:    08/05/2022  11:16 AM 01/28/2022    7:45 AM 10/27/2021    7:33 AM 07/25/2021    7:46 AM 06/25/2021   10:30 AM  Fall Risk   Falls in the past year? 0 0 0 0 0  Number falls in past yr: 0 0 0 0 0  Injury with Fall? 0 0 0 0 0  Risk for fall due to : No Fall Risks No Fall Risks No Fall Risks No Fall Risks   Follow up Falls prevention discussed Falls prevention discussed Falls prevention discussed Falls prevention discussed Falls evaluation completed     Functional Status Survey: Is the patient deaf or have difficulty hearing?: No Does the patient have difficulty seeing, even when wearing glasses/contacts?: No Does the patient have difficulty concentrating, remembering, or making decisions?: No Does the patient have difficulty walking or climbing stairs?: No Does the patient have difficulty dressing or bathing?: No Does the patient have difficulty doing errands alone such as visiting a doctor's office or shopping?: No    Assessment & Plan  1. Dyslipidemia associated with type 2 diabetes mellitus (HCC)  - POCT HgB A1C - HM Diabetes Foot Exam - Lipid panel  2. 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 Need for immunization against influenza  - Flu Vaccine QUAD 6+ mos PF IM (Fluarix Quad PF)  4. Dyslipidemia   5. Acquired hypothyroidism  - TSH  6. Obesity (BMI 30.0-34.9)  Discussed with the patient the risk posed by an increased BMI. Discussed importance of portion control, calorie counting and at least  150 minutes of physical activity weekly. Avoid sweet beverages and drink more water. Eat at least 6 servings of fruit and vegetables daily    7. Vitamin D deficiency  - VITAMIN D 25 Hydroxy (Vit-D Deficiency, Fractures)  8. Need for shingles vaccine  - Zoster Vaccine Adjuvanted [facility]) injection; Inject 0.5 mLs into the muscle once for 1 dose.  Dispense: 0.5 mL; Refill: 0  9. Microalbuminuria  - Zoster Vaccine Adjuvanted [facility]) injection; Inject 0.5 mLs into the muscle once for 1 dose.  Dispense: 0.5 mL; Refill: 0  10. Rhinorrhea  She refuses to be checked for COVID  11. Diabetes mellitus type 2 in obese (HCC)  - Semaglutide (RYBELSUS) 7 MG TABS; Take 7 mg by mouth daily.  Dispense: 90 tablet; Refill: 1

## 2022-08-05 ENCOUNTER — Ambulatory Visit (INDEPENDENT_AMBULATORY_CARE_PROVIDER_SITE_OTHER): Payer: Managed Care, Other (non HMO) | Admitting: Family Medicine

## 2022-08-05 ENCOUNTER — Encounter: Payer: Self-pay | Admitting: Family Medicine

## 2022-08-05 VITALS — BP 122/76 | HR 90 | Resp 16 | Ht 62.0 in | Wt 176.0 lb

## 2022-08-05 DIAGNOSIS — E1122 Type 2 diabetes mellitus with diabetic chronic kidney disease: Secondary | ICD-10-CM | POA: Diagnosis not present

## 2022-08-05 DIAGNOSIS — E785 Hyperlipidemia, unspecified: Secondary | ICD-10-CM | POA: Diagnosis not present

## 2022-08-05 DIAGNOSIS — Z23 Encounter for immunization: Secondary | ICD-10-CM

## 2022-08-05 DIAGNOSIS — E1169 Type 2 diabetes mellitus with other specified complication: Secondary | ICD-10-CM

## 2022-08-05 DIAGNOSIS — R809 Proteinuria, unspecified: Secondary | ICD-10-CM

## 2022-08-05 DIAGNOSIS — E559 Vitamin D deficiency, unspecified: Secondary | ICD-10-CM

## 2022-08-05 DIAGNOSIS — E039 Hypothyroidism, unspecified: Secondary | ICD-10-CM

## 2022-08-05 DIAGNOSIS — J3489 Other specified disorders of nose and nasal sinuses: Secondary | ICD-10-CM

## 2022-08-05 DIAGNOSIS — E669 Obesity, unspecified: Secondary | ICD-10-CM

## 2022-08-05 DIAGNOSIS — I129 Hypertensive chronic kidney disease with stage 1 through stage 4 chronic kidney disease, or unspecified chronic kidney disease: Secondary | ICD-10-CM

## 2022-08-05 LAB — POCT GLYCOSYLATED HEMOGLOBIN (HGB A1C): Hemoglobin A1C: 5.6 % (ref 4.0–5.6)

## 2022-08-05 MED ORDER — HYDROCHLOROTHIAZIDE 12.5 MG PO TABS: 12.5000 mg | ORAL_TABLET | Freq: Every day | ORAL | 1 refills | Status: DC

## 2022-08-05 MED ORDER — METOPROLOL SUCCINATE ER 100 MG PO TB24: 100.0000 mg | ORAL_TABLET | Freq: Every day | ORAL | 1 refills | Status: DC

## 2022-08-05 MED ORDER — SHINGRIX 50 MCG/0.5ML IM SUSR: 0.5000 mL | Freq: Once | INTRAMUSCULAR | 0 refills | Status: AC

## 2022-08-05 MED ORDER — AMLODIPINE BESYLATE 5 MG PO TABS: 5.0000 mg | ORAL_TABLET | Freq: Every day | ORAL | 0 refills | Status: DC

## 2022-08-05 MED ORDER — RYBELSUS 7 MG PO TABS: 7.0000 mg | ORAL_TABLET | Freq: Every day | ORAL | 1 refills | Status: DC

## 2022-08-06 LAB — CBC WITH DIFFERENTIAL/PLATELET
Basophils Absolute: 0.1 10*3/uL (ref 0.0–0.2)
Basos: 1 %
EOS (ABSOLUTE): 0.2 10*3/uL (ref 0.0–0.4)
Eos: 2 %
Hematocrit: 41.1 % (ref 34.0–46.6)
Hemoglobin: 13.6 g/dL (ref 11.1–15.9)
Immature Grans (Abs): 0 10*3/uL (ref 0.0–0.1)
Immature Granulocytes: 0 %
Lymphocytes Absolute: 2.2 10*3/uL (ref 0.7–3.1)
Lymphs: 22 %
MCH: 28.9 pg (ref 26.6–33.0)
MCHC: 33.1 g/dL (ref 31.5–35.7)
MCV: 87 fL (ref 79–97)
Monocytes Absolute: 0.9 10*3/uL (ref 0.1–0.9)
Monocytes: 9 %
Neutrophils Absolute: 6.7 10*3/uL (ref 1.4–7.0)
Neutrophils: 66 %
Platelets: 337 10*3/uL (ref 150–450)
RBC: 4.71 x10E6/uL (ref 3.77–5.28)
RDW: 13.5 % (ref 11.7–15.4)
WBC: 9.9 10*3/uL (ref 3.4–10.8)

## 2022-08-06 LAB — LIPID PANEL
Chol/HDL Ratio: 3.1 ratio (ref 0.0–4.4)
Cholesterol, Total: 161 mg/dL (ref 100–199)
HDL: 52 mg/dL (ref >39)
LDL Chol Calc (NIH): 93 mg/dL (ref 0–99)
Triglycerides: 88 mg/dL (ref 0–149)
VLDL Cholesterol Cal: 16 mg/dL (ref 5–40)

## 2022-08-06 LAB — COMPREHENSIVE METABOLIC PANEL
ALT: 13 IU/L (ref 0–32)
AST: 15 IU/L (ref 0–40)
Albumin/Globulin Ratio: 1.6 (ref 1.2–2.2)
Albumin: 5.1 g/dL — ABNORMAL HIGH (ref 3.8–4.9)
Alkaline Phosphatase: 90 IU/L (ref 44–121)
BUN/Creatinine Ratio: 14 (ref 9–23)
BUN: 12 mg/dL (ref 6–24)
Bilirubin Total: 0.3 mg/dL (ref 0.0–1.2)
CO2: 27 mmol/L (ref 20–29)
Calcium: 10.1 mg/dL (ref 8.7–10.2)
Chloride: 98 mmol/L (ref 96–106)
Creatinine, Ser: 0.87 mg/dL (ref 0.57–1.00)
Globulin, Total: 3.1 g/dL (ref 1.5–4.5)
Glucose: 93 mg/dL (ref 70–99)
Potassium: 4 mmol/L (ref 3.5–5.2)
Sodium: 141 mmol/L (ref 134–144)
Total Protein: 8.2 g/dL (ref 6.0–8.5)
eGFR: 77 mL/min/{1.73_m2} (ref >59)

## 2022-08-06 LAB — MICROALBUMIN / CREATININE URINE RATIO
Creatinine, Urine: 26.9 mg/dL
Microalb/Creat Ratio: <11 mg/g creat (ref 0–29)
Microalbumin, Urine: <3 ug/mL

## 2022-08-06 LAB — TSH: TSH: 1.11 u[IU]/mL (ref 0.450–4.500)

## 2022-08-06 LAB — VITAMIN D 25 HYDROXY (VIT D DEFICIENCY, FRACTURES): Vit D, 25-Hydroxy: 57.8 ng/mL (ref 30.0–100.0)

## 2022-10-11 ENCOUNTER — Other Ambulatory Visit: Payer: Self-pay | Admitting: Family Medicine

## 2022-10-11 DIAGNOSIS — E039 Hypothyroidism, unspecified: Secondary | ICD-10-CM

## 2022-10-21 ENCOUNTER — Other Ambulatory Visit: Payer: Self-pay | Admitting: Family Medicine

## 2022-10-21 DIAGNOSIS — I129 Hypertensive chronic kidney disease with stage 1 through stage 4 chronic kidney disease, or unspecified chronic kidney disease: Secondary | ICD-10-CM

## 2022-11-25 NOTE — Patient Instructions (Signed)
Preventive Care 40-58 Years Old, Female Preventive care refers to lifestyle choices and visits with your health care provider that can promote health and wellness. Preventive care visits are also called wellness exams. What can I expect for my preventive care visit? Counseling Your health care provider may ask you questions about your: Medical history, including: Past medical problems. Family medical history. Pregnancy history. Current health, including: Menstrual cycle. Method of birth control. Emotional well-being. Home life and relationship well-being. Sexual activity and sexual health. Lifestyle, including: Alcohol, nicotine or tobacco, and drug use. Access to firearms. Diet, exercise, and sleep habits. Work and work environment. Sunscreen use. Safety issues such as seatbelt and bike helmet use. Physical exam Your health care provider will check your: Height and weight. These may be used to calculate your BMI (body mass index). BMI is a measurement that tells if you are at a healthy weight. Waist circumference. This measures the distance around your waistline. This measurement also tells if you are at a healthy weight and may help predict your risk of certain diseases, such as type 2 diabetes and high blood pressure. Heart rate and blood pressure. Body temperature. Skin for abnormal spots. What immunizations do I need?  Vaccines are usually given at various ages, according to a schedule. Your health care provider will recommend vaccines for you based on your age, medical history, and lifestyle or other factors, such as travel or where you work. What tests do I need? Screening Your health care provider may recommend screening tests for certain conditions. This may include: Lipid and cholesterol levels. Diabetes screening. This is done by checking your blood sugar (glucose) after you have not eaten for a while (fasting). Pelvic exam and Pap test. Hepatitis B test. Hepatitis C  test. HIV (human immunodeficiency virus) test. STI (sexually transmitted infection) testing, if you are at risk. Lung cancer screening. Colorectal cancer screening. Mammogram. Talk with your health care provider about when you should start having regular mammograms. This may depend on whether you have a family history of breast cancer. BRCA-related cancer screening. This may be done if you have a family history of breast, ovarian, tubal, or peritoneal cancers. Bone density scan. This is done to screen for osteoporosis. Talk with your health care provider about your test results, treatment options, and if necessary, the need for more tests. Follow these instructions at home: Eating and drinking  Eat a diet that includes fresh fruits and vegetables, whole grains, lean protein, and low-fat dairy products. Take vitamin and mineral supplements as recommended by your health care provider. Do not drink alcohol if: Your health care provider tells you not to drink. You are pregnant, may be pregnant, or are planning to become pregnant. If you drink alcohol: Limit how much you have to 0-1 drink a day. Know how much alcohol is in your drink. In the [place] one drink equals one 12 oz bottle of beer (355 mL), one 5 oz glass of wine (148 mL), or one 1 oz glass of hard liquor (44 mL). Lifestyle Brush your teeth every morning and night with fluoride toothpaste. Floss one time each day. Exercise for at least 30 minutes 5 or more days each week. Do not use any products that contain nicotine or tobacco. These products include cigarettes, chewing tobacco, and vaping devices, such as e-cigarettes. If you need help quitting, ask your health care provider. Do not use drugs. If you are sexually active, practice safe sex. Use a condom or other form of protection to   prevent STIs. If you do not wish to become pregnant, use a form of birth control. If you plan to become pregnant, see your health care provider for a  prepregnancy visit. Take aspirin only as told by your health care provider. Make sure that you understand how much to take and what form to take. Work with your health care provider to find out whether it is safe and beneficial for you to take aspirin daily. Find healthy ways to manage stress, such as: Meditation, yoga, or listening to music. Journaling. Talking to a trusted person. Spending time with friends and family. Minimize exposure to UV radiation to reduce your risk of skin cancer. Safety Always wear your seat belt while driving or riding in a vehicle. Do not drive: If you have been drinking alcohol. Do not ride with someone who has been drinking. When you are tired or distracted. While texting. If you have been using any mind-altering substances or drugs. Wear a helmet and other protective equipment during sports activities. If you have firearms in your house, make sure you follow all gun safety procedures. Seek help if you have been physically or sexually abused. What's next? Visit your health care provider once a year for an annual wellness visit. Ask your health care provider how often you should have your eyes and teeth checked. Stay up to date on all vaccines. This information is not intended to replace advice given to you by your health care provider. Make sure you discuss any questions you have with your health care provider. Document Revised: 05/28/2021 Document Reviewed: 05/28/2021 Elsevier Patient Education  [place].

## 2022-11-25 NOTE — Progress Notes (Signed)
Name: Sherry Patel   MRN: [id number]    DOB: [birth year]   Date:11/26/2022       Progress Note  Subjective  Chief Complaint  Annual Exam  HPI  Patient presents for annual CPE.  Diet: she has been eating only one meal per day, she splurged during holidays. Usually a balanced diet  Exercise: discussed 150 minutes per week   Last Eye Exam: up to date Last Dental Exam: up to date   [employer] Visit from 11/26/2022 in [facility]  AUDIT-C Score 0      Depression: Phq 9 is  negative    11/26/2022   10:28 AM 08/05/2022   11:16 AM 01/28/2022    7:45 AM 10/27/2021    7:33 AM 07/25/2021    7:46 AM  Depression screen PHQ 2/9  Decreased Interest 0 0 0 0 0  Down, Depressed, Hopeless 0 0 0 0 0  PHQ - 2 Score 0 0 0 0 0  Altered sleeping 0 0 0 0   Tired, decreased energy 0 0 0 0   Change in appetite 0 0 0 0   Feeling bad or failure about yourself  0 0 0 0   Trouble concentrating 0 0 0 0   Moving slowly or fidgety/restless 0 0 0 0   Suicidal thoughts 0 0 0 0   PHQ-9 Score 0 0 0 0    Hypertension: BP Readings from Last 3 Encounters:  11/26/22 122/72  08/05/22 122/76  01/28/22 128/84   Obesity: Wt Readings from Last 3 Encounters:  11/26/22 181 lb 4.8 oz (82.2 kg)  08/05/22 176 lb (79.8 kg)  01/28/22 182 lb (82.6 kg)   BMI Readings from Last 3 Encounters:  11/26/22 33.16 kg/m  08/05/22 32.19 kg/m  01/28/22 33.29 kg/m     Vaccines:   Tdap: up to date Shingrix: she will ask insurance about coveraeg  Pneumonia: N/A Flu: up to date COVID-38: up to date   Hep C Screening: 05/23/19 STD testing and prevention (HIV/chl/gon/syphilis): 05/23/19 Intimate partner violence: negative screen  Sexual History : no problems  Menstrual History/LMP/Abnormal Bleeding: post-menopausal  Discussed importance of follow up if any post-menopausal bleeding: yes  Incontinence Symptoms: negative for symptoms   Breast cancer:  - Last Mammogram: 07/02/22 - BRCA  gene screening: N/A  Osteoporosis Prevention : Discussed high calcium and vitamin D supplementation, weight bearing exercises Bone density: N/A  Cervical cancer screening: 05/23/19  Skin cancer: Discussed monitoring for atypical lesions  Colorectal cancer: 06/10/19   Lung cancer:  Low Dose CT Chest recommended if Age 72-80 years, 20 pack-year currently smoking OR have quit w/in 15years. Patient does not qualify for screen   ECG: 05/23/19  Advanced Care Planning: A voluntary discussion about advance care planning including the explanation and discussion of advance directives.  Discussed health care proxy and Living will, and the patient was able to identify a health care proxy as husband .  Patient does not have a living will and power of attorney of health care   Lipids: Lab Results  Component Value Date   CHOL 161 08/05/2022   CHOL 132 01/28/2022   CHOL 259 (H) 10/27/2021   Lab Results  Component Value Date   HDL 52 08/05/2022   HDL 39 (L) 01/28/2022   HDL 44 10/27/2021   Lab Results  Component Value Date   LDLCALC 93 08/05/2022   LDLCALC 78 01/28/2022   LDLCALC 198 (H) 10/27/2021   Lab Results  Component Value Date   TRIG 88 08/05/2022   TRIG 70 01/28/2022   TRIG 96 10/27/2021   Lab Results  Component Value Date   CHOLHDL 3.1 08/05/2022   CHOLHDL 3.4 01/28/2022   CHOLHDL 5.9 (H) 10/27/2021   No results found for: "LDLDIRECT"  Glucose: Glucose  Date Value Ref Range Status  08/05/2022 93 70 - 99 mg/dL Final  10/27/2021 84 70 - 99 mg/dL Final  12/03/2020 82 65 - 99 mg/dL Final   Glucose, Bld  Date Value Ref Range Status  01/28/2022 86 65 - 99 mg/dL Final    Comment:    .            Fasting reference interval .   12/04/2019 82 65 - 99 mg/dL Final    Comment:    .            Fasting reference interval .     Patient Active Problem List   Diagnosis Date Noted   Diabetes mellitus type 2 in obese ([place]) 07/25/2021   Prediabetes 12/04/2019   Pure  hypercholesterolemia 12/04/2019   Microalbuminuria 05/23/2019   Acquired hypothyroidism 12/31/2015   IFG (impaired fasting glucose) 09/25/2015   Hypertensive chronic kidney disease 09/25/2015   Menopause 09/25/2015    Past Surgical History:  Procedure Laterality Date   breast biopy Left 20 years ago   BREAST EXCISIONAL BIOPSY Left    cyst removed    Family History  Problem Relation Age of Onset   Hypertension Mother    Hypertension Maternal Grandmother    Hypertension Maternal Grandfather     Social History   Socioeconomic History   Marital status: Married    Spouse name: Not on file   Number of children: 4   Years of education: Not on file   Highest education level: High school graduate  Occupational History   Occupation: Accounts payable    Employer: [employer]  Tobacco Use   Smoking status: Former    Packs/day: 1.00    Types: Cigarettes    Start date: 01/11/1979    Quit date: 07/15/1983    Years since quitting: 39.3   Smokeless tobacco: Never  Vaping Use   Vaping Use: Never used  Substance and Sexual Activity   Alcohol use: No   Drug use: No   Sexual activity: Yes    Partners: Male    Birth control/protection: Post-menopausal  Other Topics Concern   Not on file  Social History Narrative   Not on file   Social Determinants of Health   Financial Resource Strain: Low Risk  (11/26/2022)   Overall Financial Resource Strain (CARDIA)    Difficulty of Paying Living Expenses: Not hard at all  Food Insecurity: No Food Insecurity (11/26/2022)   Hunger Vital Sign    Worried About Running Out of Food in the Last Year: Never true    [place] in the Last Year: Never true  Transportation Needs: No Transportation Needs (11/26/2022)   PRAPARE - Hydrologist (Medical): No    Lack of Transportation (Non-Medical): No  Physical Activity: Insufficiently Active (11/26/2022)   Exercise Vital Sign    Days of Exercise per Week: 3 days     Minutes of Exercise per Session: 30 min  Stress: No Stress Concern Present (11/26/2022)   [place]    Feeling of Stress : Only a little  Social Connections: Socially Integrated (11/26/2022)   Social  Connection and Isolation Panel [NHANES]    Frequency of Communication with Friends and Family: Twice a week    Frequency of Social Gatherings with Friends and Family: Once a week    Attends Religious Services: More than 4 times per year    Active Member of [employer] or Organizations: Yes    Attends Music therapist: More than 4 times per year    Marital Status: Married  Human resources officer Violence: Not At Risk (11/26/2022)   Humiliation, Afraid, Rape, and Kick questionnaire    Fear of Current or Ex-Partner: No    Emotionally Abused: No    Physically Abused: No    Sexually Abused: No     Current Outpatient Medications:    amLODipine (NORVASC) 5 MG tablet, TAKE 1 TABLET(5 MG) BY MOUTH DAILY, Disp: 90 tablet, Rfl: 0   Ascorbic Acid (VITAMIN C) 100 MG tablet, Take 100 mg by mouth daily., Disp: , Rfl:    hydrochlorothiazide (HYDRODIURIL) 12.5 MG tablet, Take 1 tablet (12.5 mg total) by mouth daily., Disp: 90 tablet, Rfl: 1   levothyroxine (SYNTHROID) 50 MCG tablet, TAKE 1 TABLET(50 MCG) BY MOUTH DAILY BEFORE BREAKFAST, Disp: 90 tablet, Rfl: 0   metoprolol succinate (TOPROL-XL) 100 MG 24 hr tablet, Take 1 tablet (100 mg total) by mouth daily. Take with or immediately following a meal., Disp: 90 tablet, Rfl: 1   Multiple Vitamins-Minerals (MULTIVITAMIN WITH MINERALS) tablet, Take 1 tablet by mouth daily., Disp: , Rfl:    Omega-3 Fatty Acids (FISH OIL) 1000 MG CAPS, Take by mouth., Disp: , Rfl:    rosuvastatin (CRESTOR) 40 MG tablet, TAKE 1 TABLET(40 MG) BY MOUTH DAILY, Disp: 90 tablet, Rfl: 1   Semaglutide (RYBELSUS) 7 MG TABS, Take 7 mg by mouth daily., Disp: 90 tablet, Rfl: 1  Allergies  Allergen Reactions   Lisinopril  Swelling    Angioedema in 2018     ROS  Constitutional: Negative for fever or weight change.  Respiratory: Negative for cough and shortness of breath.   Cardiovascular: Negative for chest pain, occasional  palpitations.  Gastrointestinal: Negative for abdominal pain, no bowel changes.  Musculoskeletal: Negative for gait problem or joint swelling.  Skin: Negative for rash.  Neurological: Negative for dizziness or headache.  No other specific complaints in a complete review of systems (except as listed in HPI above).   Objective  Vitals:   11/26/22 1027  BP: 122/72  Pulse: 64  Resp: 14  Temp: 98 F (36.7 C)  TempSrc: Oral  SpO2: 99%  Weight: 181 lb 4.8 oz (82.2 kg)  Height: _0  (1.575 m)    Body mass index is 33.16 kg/m.  Physical Exam  Constitutional: Patient appears well-developed and well-nourished. No distress.  HENT: Head: Normocephalic and atraumatic. Ears: B TMs ok, no erythema or effusion; Nose: Nose normal. Mouth/Throat: Oropharynx is clear and moist. No oropharyngeal exudate.  Eyes: Conjunctivae and EOM are normal. Pupils are equal, round, and reactive to light. No scleral icterus.  Neck: Normal range of motion. Neck supple. No JVD present. No thyromegaly present.  Cardiovascular: Normal rate, regular rhythm and normal heart sounds.  No murmur heard. No BLE edema. Pulmonary/Chest: Effort normal and breath sounds normal. No respiratory distress. Abdominal: Soft. Bowel sounds are normal, no distension. There is no tenderness. no masses Breast: no lumps or masses, no nipple discharge or rashes FEMALE GENITALIA:  External genitalia normal External urethra normal Vaginal vault white fluid discharge Cervix normal without discharge or lesions Bimanual exam normal  without masses RECTAL: anal skin tag Musculoskeletal: Normal range of motion, no joint effusions. No gross deformities Neurological: he is alert and oriented to person, place, and time. No cranial nerve  deficit. Coordination, balance, strength, speech and gait are normal.  Skin: Skin is warm and dry. No rash noted. No erythema.  Psychiatric: Patient has a normal mood and affect. behavior is normal. Judgment and thought content normal.   Fall Risk:    11/26/2022   10:29 AM 08/05/2022   11:16 AM 01/28/2022    7:45 AM 10/27/2021    7:33 AM 07/25/2021    7:46 AM  Fall Risk   Falls in the past year? 0 0 0 0 0  Number falls in past yr:  0 0 0 0  Injury with Fall?  0 0 0 0  Risk for fall due to : _0   Follow up Falls prevention discussed;Education provided;Falls evaluation completed Falls prevention discussed Falls prevention discussed Falls prevention discussed Falls prevention discussed     Functional Status Survey: Is the patient deaf or have difficulty hearing?: No Does the patient have difficulty seeing, even when wearing glasses/contacts?: No Does the patient have difficulty concentrating, remembering, or making decisions?: No Does the patient have difficulty walking or climbing stairs?: No Does the patient have difficulty dressing or bathing?: No Does the patient have difficulty doing errands alone such as visiting a doctor's office or shopping?: No   Assessment & Plan  1. Well adult exam     -USPSTF grade A and B recommendations reviewed with patient; age-appropriate recommendations, preventive care, screening tests, etc discussed and encouraged; healthy living encouraged; see AVS for patient education given to patient -Discussed importance of 150 minutes of physical activity weekly, eat two servings of fish weekly, eat one serving of tree nuts ( cashews, pistachios, pecans, almonds.Marland Kitchen) every other day, eat 6 servings of fruit/vegetables daily and drink plenty of water and avoid sweet beverages.   -Reviewed Health Maintenance: Yes.

## 2022-11-26 ENCOUNTER — Encounter: Payer: Self-pay | Admitting: Family Medicine

## 2022-11-26 ENCOUNTER — Ambulatory Visit (INDEPENDENT_AMBULATORY_CARE_PROVIDER_SITE_OTHER): Payer: Managed Care, Other (non HMO) | Admitting: Family Medicine

## 2022-11-26 ENCOUNTER — Other Ambulatory Visit (HOSPITAL_COMMUNITY)
Admission: RE | Admit: 2022-11-26 | Discharge: 2022-11-26 | Disposition: A | Discharge status: Home / Self Care | Payer: Managed Care, Other (non HMO) | Source: Ambulatory Visit | Attending: Family Medicine | Admitting: Family Medicine

## 2022-11-26 VITALS — BP 122/72 | HR 64 | Temp 98.0°F | Resp 14 | Ht 62.0 in | Wt 181.3 lb

## 2022-11-26 DIAGNOSIS — Z124 Encounter for screening for malignant neoplasm of cervix: Secondary | ICD-10-CM

## 2022-11-26 DIAGNOSIS — Z0000 Encounter for general adult medical examination without abnormal findings: Secondary | ICD-10-CM | POA: Diagnosis present

## 2022-11-30 LAB — CYTOLOGY - PAP
Comment: NEGATIVE
Diagnosis: NEGATIVE
Diagnosis: REACTIVE
High risk HPV: POSITIVE — AB

## 2023-01-04 IMAGING — MG MM DIGITAL SCREENING BILAT W/ TOMO AND CAD
8 series · 8 of 24 positions shown · non-contrast
Comparison: Previous exam(s).

CLINICAL DATA: Screening.

EXAM:
DIGITAL SCREENING BILATERAL MAMMOGRAM WITH TOMOSYNTHESIS AND CAD
TECHNIQUE: Bilateral screening digital craniocaudal and mediolateral oblique
mammograms were obtained. Bilateral screening digital breast
tomosynthesis was performed. The images were evaluated with
computer-aided detection.

[R CC synth-2D]
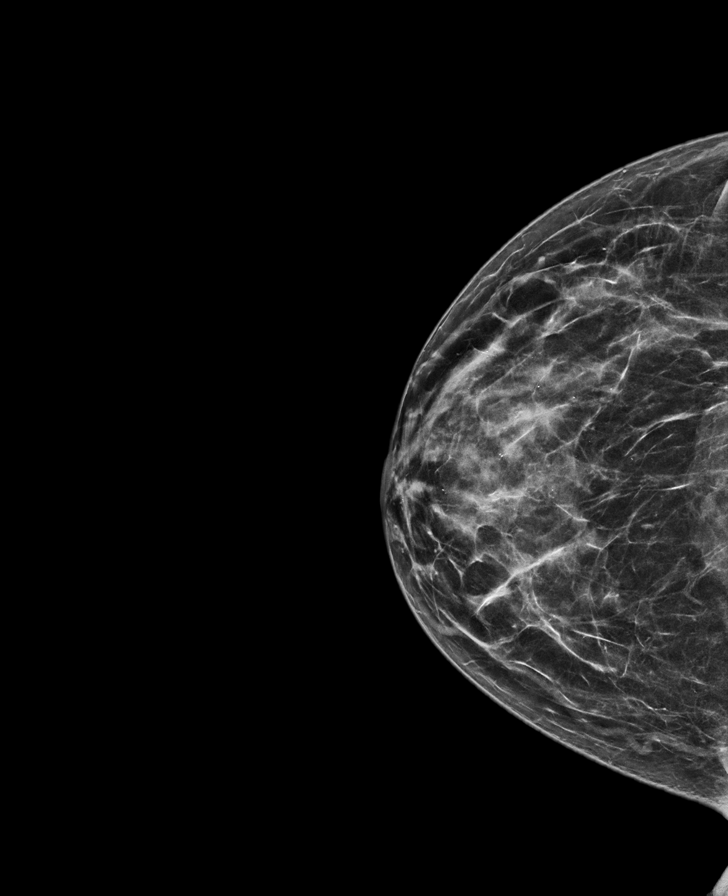

[L CC synth-2D]
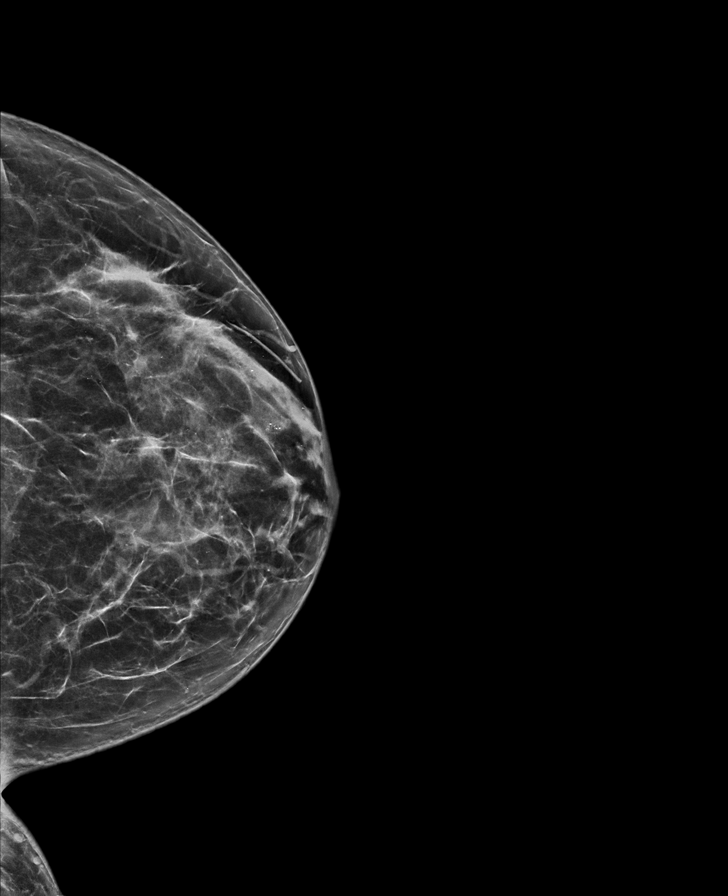

[L MLO synth-2D]
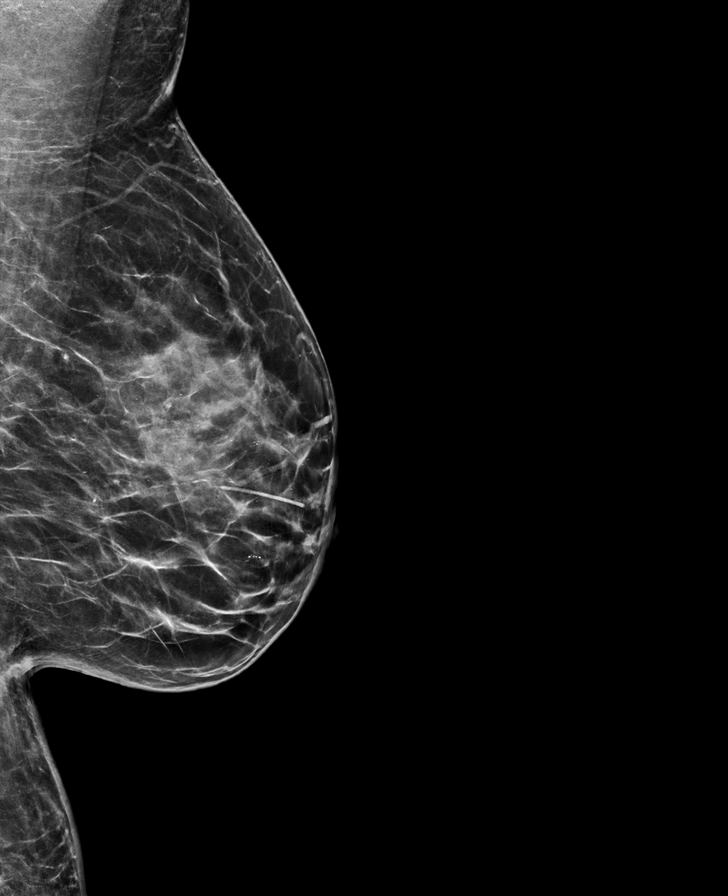

[R MLO synth-2D]
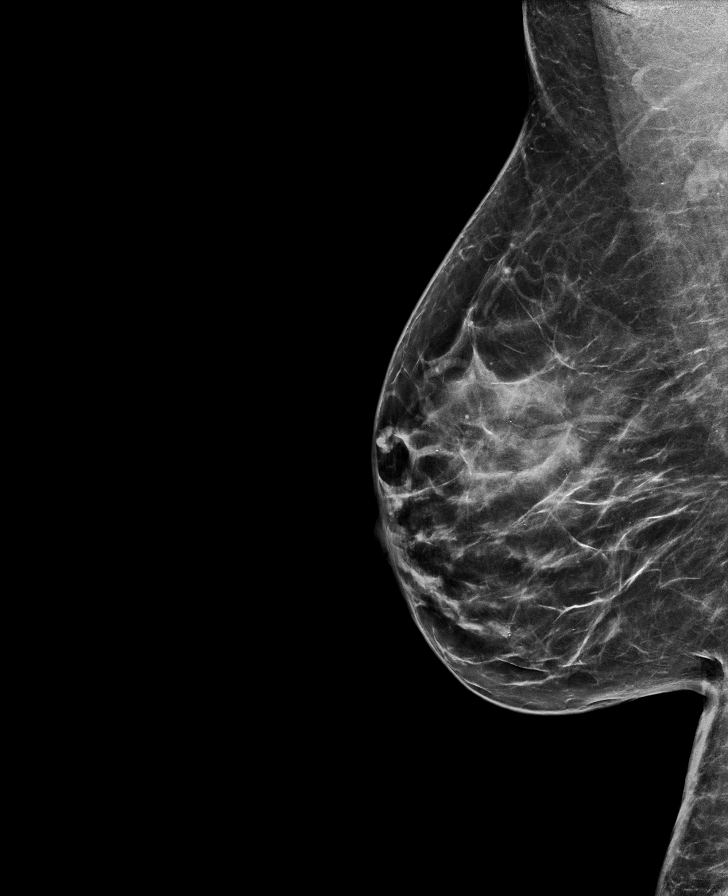

[R MLO tomo · tomo slice 35/69.0]
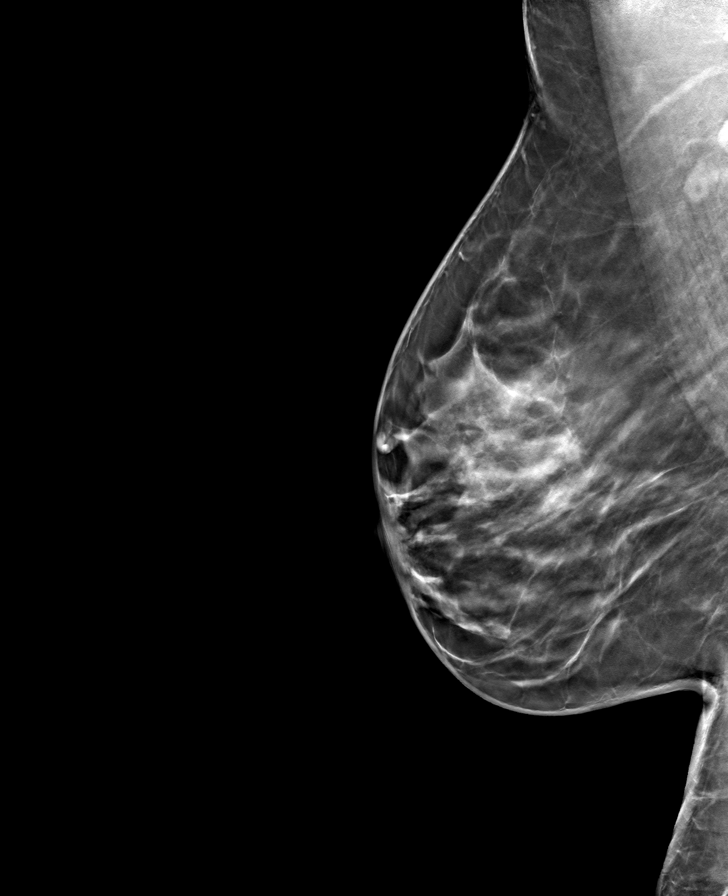

[L MLO tomo · tomo slice 33/65.0]
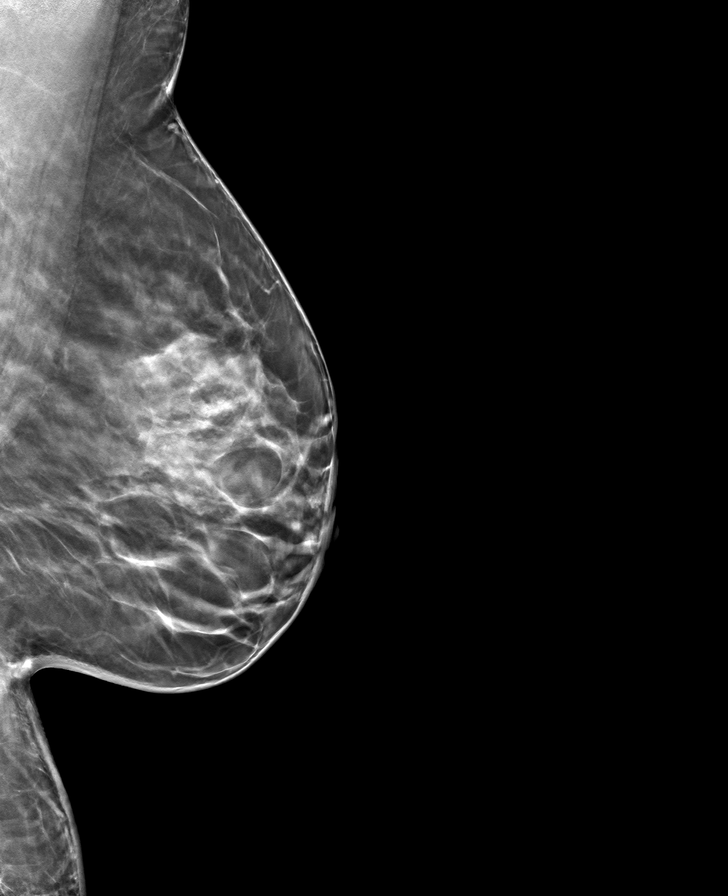

[R CC tomo · tomo slice 31/62.0]
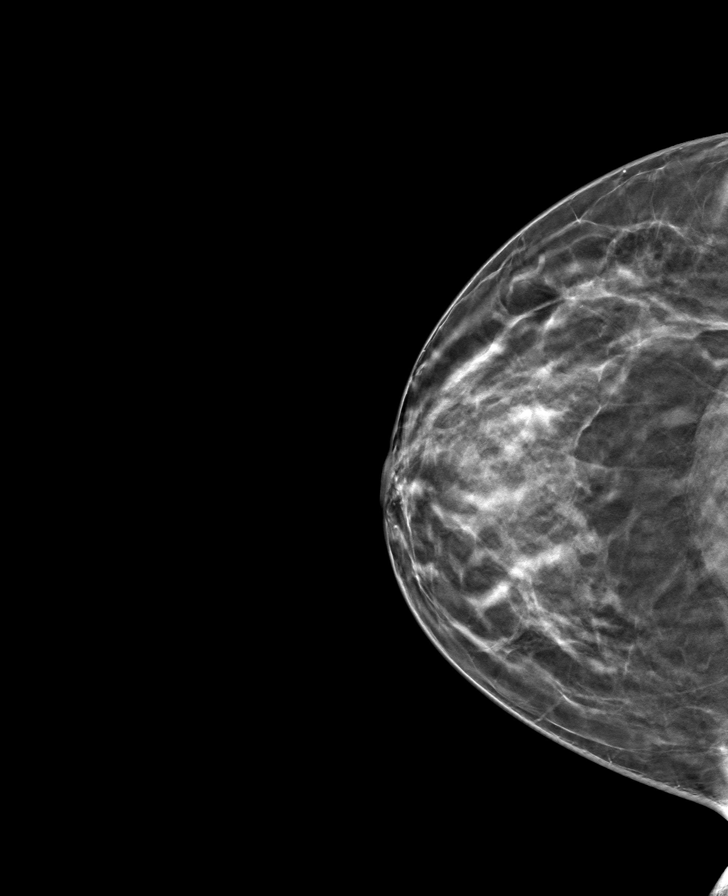

[L CC tomo · tomo slice 35/69.0]
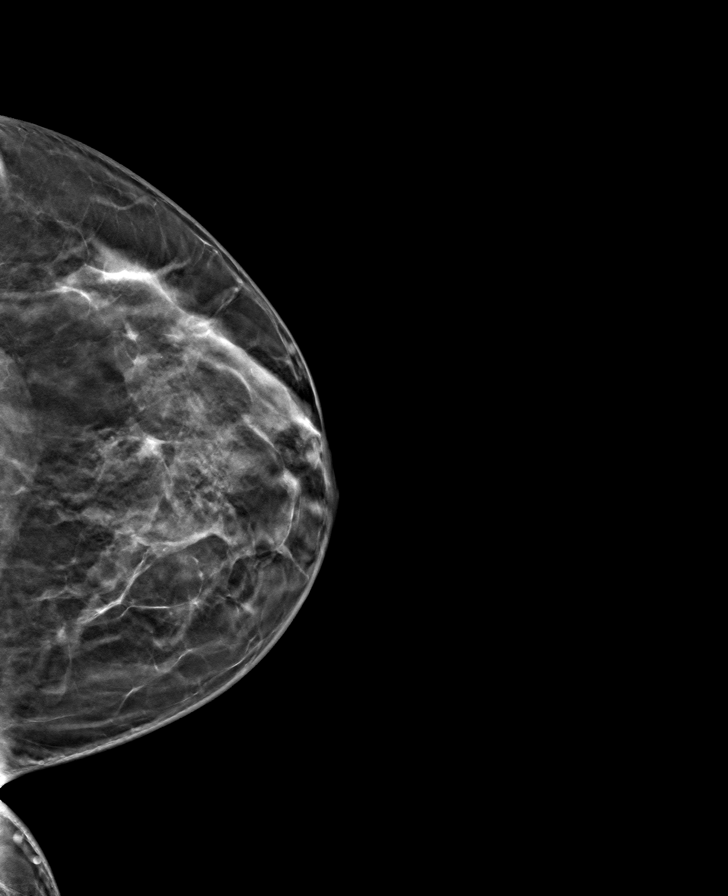

[8 of 24 positions shown; findings below may reference images not displayed]

ACR Breast Density Category c: The breast tissue is heterogeneously
dense, which may obscure small masses.
FINDINGS: There are no findings suspicious for malignancy. The images were
evaluated with computer-aided detection.
IMPRESSION: No mammographic evidence of malignancy. A result letter of this
screening mammogram will be mailed directly to the patient.

RECOMMENDATION:
Screening mammogram in one year. (Code:T4-5-GWO)

BI-RADS CATEGORY  1: Negative.

## 2023-01-21 ENCOUNTER — Other Ambulatory Visit: Payer: Self-pay | Admitting: Family Medicine

## 2023-01-21 DIAGNOSIS — E039 Hypothyroidism, unspecified: Secondary | ICD-10-CM

## 2023-02-05 ENCOUNTER — Encounter: Payer: Managed Care, Other (non HMO) | Admitting: Family Medicine

## 2023-02-22 NOTE — Progress Notes (Deleted)
Name: Sherry Patel   MRN: [id number]    DOB: [birth year]   Date:02/22/2023       Progress Note  Subjective  Chief Complaint  Follow Up  HPI  DMII: last A1C in Dec 21 was 6.7 % down to  6.3 %, 6.1 % and last visit it was 5.9 %  She denies polyphagia, polydipsia or polyuria. She has a history of dyslipidemia, obesity and microalbuminuria. She cannot take ACE because of history of angioedema, unable to tolerate Jardiance caused sore throat, she has been on Rybelsus since Summer 2022 and is doing well. She states medication curbs her appetite, she does not want to go up on the dose  . Last LDL was very high and she has been taking Rosuvastatin 40 mg daily,  Hypothyroidism: she has been taking levothyroxine 50 mcg daily, last TSH was normal  Has had palpitations intermittently for many years - they occur when she's stressed but it is sporadic now - controlled with metoprolol .  She denies dysphagia, dry skin or change in bowel movement at this time    HTN: BP has been under control She is taking medication as prescribed , no side effects. She has CKI, but cannot take ACE/ARB due to history of angioedema, unable to tolerate Jardiance. We will continue Norvasc and Metoprolol  since she is tolerating medications well    CKD: Had elevated microalbumin in May 2019; kidney function has been stable, denies pruritis. We tried adding Jardiance on her last visit but unable to tolerate, she states it causes sore throat, she stopped and tried again a couple of weeks later and it happened again We are keeping diabetes and bp under control and we will continue to monitor it     Hyperlipidemia: she is back on Crestor and we will recheck labs    Obesity: She states since menopause she had  craving carbohydrates. She states weight used to be around 175 lbs but gradually gained weight over the past few years, she was 205 in May 2022 started on Rybelsus and she has lost 29 lbs , today's weight is 176 lbs, at this time  she does not want to go up on dose of Rybelsus   Rhinorrhea: she refused to get covid tested, advised to wear a mask, denies fatigue, sore throat, headaches. She is taking otc medications for now    Patient Active Problem List   Diagnosis Date Noted   Diabetes mellitus type 2 in obese ([place]) 07/25/2021   Prediabetes 12/04/2019   Pure hypercholesterolemia 12/04/2019   Microalbuminuria 05/23/2019   Acquired hypothyroidism 12/31/2015   IFG (impaired fasting glucose) 09/25/2015   Hypertensive chronic kidney disease 09/25/2015   Menopause 09/25/2015    Past Surgical History:  Procedure Laterality Date   breast biopy Left 20 years ago   BREAST EXCISIONAL BIOPSY Left    cyst removed    Family History  Problem Relation Age of Onset   Hypertension Mother    Hypertension Maternal Grandmother    Hypertension Maternal Grandfather     Social History   Tobacco Use   Smoking status: Former    Packs/day: 1.00    Types: Cigarettes    Start date: 01/11/1979    Quit date: 07/15/1983    Years since quitting: 39.6   Smokeless tobacco: Never  Substance Use Topics   Alcohol use: No     Current Outpatient Medications:    amLODipine (NORVASC) 5 MG tablet, TAKE 1 TABLET(5 MG) BY MOUTH DAILY,  Disp: 90 tablet, Rfl: 0   Ascorbic Acid (VITAMIN C) 100 MG tablet, Take 100 mg by mouth daily., Disp: , Rfl:    hydrochlorothiazide (HYDRODIURIL) 12.5 MG tablet, Take 1 tablet (12.5 mg total) by mouth daily., Disp: 90 tablet, Rfl: 1   levothyroxine (SYNTHROID) 50 MCG tablet, TAKE 1 TABLET(50 MCG) BY MOUTH DAILY BEFORE BREAKFAST, Disp: 90 tablet, Rfl: 0   metoprolol succinate (TOPROL-XL) 100 MG 24 hr tablet, Take 1 tablet (100 mg total) by mouth daily. Take with or immediately following a meal., Disp: 90 tablet, Rfl: 1   Multiple Vitamins-Minerals (MULTIVITAMIN WITH MINERALS) tablet, Take 1 tablet by mouth daily., Disp: , Rfl:    Omega-3 Fatty Acids (FISH OIL) 1000 MG CAPS, Take by mouth., Disp: , Rfl:     rosuvastatin (CRESTOR) 40 MG tablet, TAKE 1 TABLET(40 MG) BY MOUTH DAILY, Disp: 90 tablet, Rfl: 1   Semaglutide (RYBELSUS) 7 MG TABS, Take 7 mg by mouth daily., Disp: 90 tablet, Rfl: 1  Allergies  Allergen Reactions   Lisinopril Swelling    Angioedema in 2018    I personally reviewed active problem list, medication list, allergies, family history, social history, health maintenance with the patient/caregiver today.   ROS  ***  Objective  There were no vitals filed for this visit.  There is no height or weight on file to calculate BMI.  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   Diagnosis      - Negative for Intraepithelial Lesions or Malignancy (NILM)   Diagnosis - Benign reactive/reparative changes    Microorganisms      Fungal organisms present consistent with Candida spp.   Microorganisms Shift in flora suggestive of bacterial vaginosis    Comment Normal Reference Range HPV - Negative     PHQ2/9:    11/26/2022   10:28 AM 08/05/2022   11:16 AM 01/28/2022    7:45 AM 10/27/2021    7:33 AM 07/25/2021    7:46 AM  Depression screen PHQ 2/9  Decreased Interest 0 0 0 0 0  Down, Depressed, Hopeless 0 0 0 0 0  PHQ - 2 Score 0 0 0 0 0  Altered sleeping 0 0 0 0   Tired, decreased energy 0 0 0 0   Change in appetite 0 0 0 0   Feeling bad or failure about yourself  0 0 0 0   Trouble concentrating 0 0 0 0   Moving slowly or fidgety/restless 0 0 0 0   Suicidal thoughts 0 0 0 0   PHQ-9 Score 0 0 0 0     phq 9 is {gen pos [id number]   Fall Risk:    11/26/2022   10:29 AM 08/05/2022   11:16 AM 01/28/2022    7:45 AM 10/27/2021    7:33 AM 07/25/2021    7:46 AM  Fall Risk   Falls in the past year? 0 0 0 0 0  Number falls in past yr:  0 0  0 0  Injury with Fall?  0 0 0 0  Risk for fall due to : No Fall Risks No Fall Risks No Fall Risks No Fall Risks No Fall Risks  Follow up Falls prevention discussed;Education  provided;Falls evaluation completed Falls prevention discussed Falls prevention discussed Falls prevention discussed Falls prevention discussed      Functional Status Survey:      Assessment & Plan  *** There are no diagnoses linked to this encounter.

## 2023-02-23 ENCOUNTER — Ambulatory Visit: Payer: Managed Care, Other (non HMO) | Admitting: Family Medicine

## 2023-03-02 ENCOUNTER — Other Ambulatory Visit: Payer: Self-pay | Admitting: Family Medicine

## 2023-03-02 DIAGNOSIS — E1122 Type 2 diabetes mellitus with diabetic chronic kidney disease: Secondary | ICD-10-CM

## 2023-03-02 DIAGNOSIS — E039 Hypothyroidism, unspecified: Secondary | ICD-10-CM

## 2023-03-03 NOTE — Telephone Encounter (Signed)
The patient called back stating she doesn't believe she will have enough of the one medication, metoprolol succinate (TOPROL-XL) 100 MG 24 hr tablet to last until her appt next week. Please assist patient further

## 2023-03-03 NOTE — Telephone Encounter (Signed)
Called and left voicemail inquiring if she has enough until her appt.

## 2023-03-09 NOTE — Progress Notes (Unsigned)
Name: Sherry Patel   MRN: [id number]    DOB: 09/06/1966   Date:03/10/2023       Progress Note  Subjective  Chief Complaint  Follow Up  HPI  DMII: last A1C in Dec 21 was 6.7 % down to  6.3 %, 6.1 % 5.9 % today is 6%  She denies polyphagia, polydipsia or polyuria. She has a history of dyslipidemia, obesity and microalbuminuria. She cannot take ACE because of history of angioedema, unable to tolerate Jardiance caused sore throat, she has been on Rybelsus since Summer 2022 and is doing well. She states medication curbs her appetite, she does not want to go up on the dose. She is on statin for dyslipidemia., LDL was 198 and last time was down to 93   Hypothyroidism: she has been taking levothyroxine 50 mcg daily, last TSH was normal  Has had palpitations intermittently for many years - they occur when she's stressed but it is sporadic now - controlled with metoprolol .  She denies dysphagia, dry skin, hair loss  or change in bowel movement at this time    HTN: BP has been under control, slightly higher than usual for her today.  She is taking medication as prescribed  She has CKI, but cannot take ACE/ARB due to history of angioedema, unable to tolerate Jardiance. We will continue Norvasc, HCTZ  and Metoprolol    CKD: Had elevated microalbumin in May 2019; kidney function has been stable, denies pruritis. We tried adding Jardiance on her last visit but unable to tolerate, she states it causes sore throat, she stopped and tried again a couple of weeks later and it happened again We are keeping diabetes and bp under control and we will continue to monitor it . She has good urine output  Unchanged   Hyperlipidemia: she is back on Crestor and last LDL was good , down over 50 %    Obesity: She states since menopause she had  craving carbohydrates. She states weight used to be around 175 lbs but gradually gained weight over the past few years, she was 205 in May 2022 started on Rybelsus and she had  lost 29  lbs, it was down to 176 lbs, today her weight is up to 180 lbs. She wants to change her diet and become more active to try to lose weight again     Patient Active Problem List   Diagnosis Date Noted   Diabetes mellitus type 2 in obese ([place]) 07/25/2021   Prediabetes 12/04/2019   Pure hypercholesterolemia 12/04/2019   Microalbuminuria 05/23/2019   Acquired hypothyroidism 12/31/2015   IFG (impaired fasting glucose) 09/25/2015   Hypertensive chronic kidney disease 09/25/2015   Menopause 09/25/2015    Past Surgical History:  Procedure Laterality Date   breast biopy Left 20 years ago   BREAST EXCISIONAL BIOPSY Left    cyst removed    Family History  Problem Relation Age of Onset   Hypertension Mother    Hypertension Maternal Grandmother    Hypertension Maternal Grandfather     Social History   Tobacco Use   Smoking status: Former    Packs/day: 1    Types: Cigarettes    Start date: 01/11/1979    Quit date: 07/15/1983    Years since quitting: 39.6   Smokeless tobacco: Never  Substance Use Topics   Alcohol use: No     Current Outpatient Medications:    amLODipine (NORVASC) 5 MG tablet, TAKE 1 TABLET(5 MG) BY MOUTH DAILY, Disp:  90 tablet, Rfl: 0   Ascorbic Acid (VITAMIN C) 100 MG tablet, Take 100 mg by mouth daily., Disp: , Rfl:    hydrochlorothiazide (HYDRODIURIL) 12.5 MG tablet, Take 1 tablet (12.5 mg total) by mouth daily., Disp: 90 tablet, Rfl: 1   levothyroxine (SYNTHROID) 50 MCG tablet, TAKE 1 TABLET(50 MCG) BY MOUTH DAILY BEFORE BREAKFAST, Disp: 90 tablet, Rfl: 0   metoprolol succinate (TOPROL-XL) 100 MG 24 hr tablet, TAKE 1 TABLET(100 MG) BY MOUTH DAILY WITH OR IMMEDIATELY FOLLOWING A MEAL, Disp: 90 tablet, Rfl: 0   Multiple Vitamins-Minerals (MULTIVITAMIN WITH MINERALS) tablet, Take 1 tablet by mouth daily., Disp: , Rfl:    Omega-3 Fatty Acids (FISH OIL) 1000 MG CAPS, Take by mouth., Disp: , Rfl:    rosuvastatin (CRESTOR) 40 MG tablet, TAKE 1 TABLET(40 MG) BY MOUTH  DAILY, Disp: 90 tablet, Rfl: 1   Semaglutide (RYBELSUS) 7 MG TABS, Take 7 mg by mouth daily., Disp: 90 tablet, Rfl: 1  Allergies  Allergen Reactions   Lisinopril Swelling    Angioedema in 2018    I personally reviewed active problem list, medication list, allergies, family history, social history, health maintenance with the patient/caregiver today.   ROS  Constitutional: Negative for fever or weight change.  Respiratory: Negative for cough and shortness of breath.   Cardiovascular: Negative for chest pain or palpitations.  Gastrointestinal: Negative for abdominal pain, no bowel changes.  Musculoskeletal: Negative for gait problem or joint swelling.  Skin: Negative for rash.  Neurological: Negative for dizziness or headache.  No other specific complaints in a complete review of systems (except as listed in HPI above).   Objective  Vitals:   03/10/23 1055  BP: 132/70  Pulse: 83  Resp: 16  SpO2: 99%  Weight: 180 lb (81.6 kg)  Height: 5\' 2"  (1.575 m)    Body mass index is 32.92 kg/m.  Physical Exam  Constitutional: Patient appears well-developed and well-nourished. Obese  No distress.  HEENT: head atraumatic, normocephalic, pupils equal and reactive to light, neck supple Cardiovascular: Normal rate, regular rhythm and normal heart sounds.  No murmur heard. No BLE edema. Pulmonary/Chest: Effort normal and breath sounds normal. No respiratory distress. Abdominal: Soft.  There is no tenderness. Psychiatric: Patient has a normal mood and affect. behavior is normal. Judgment and thought content normal.    PHQ2/9:    03/10/2023   10:56 AM 11/26/2022   10:28 AM 08/05/2022   11:16 AM 01/28/2022    7:45 AM 10/27/2021    7:33 AM  Depression screen PHQ 2/9  Decreased Interest 0 0 0 0 0  Down, Depressed, Hopeless 1 0 0 0 0  PHQ - 2 Score 1 0 0 0 0  Altered sleeping 0 0 0 0 0  Tired, decreased energy 0 0 0 0 0  Change in appetite 0 0 0 0 0  Feeling bad or failure about  yourself  0 0 0 0 0  Trouble concentrating 0 0 0 0 0  Moving slowly or fidgety/restless 0 0 0 0 0  Suicidal thoughts 0 0 0 0 0  PHQ-9 Score 1 0 0 0 0    phq 9 is negative   Fall Risk:    03/10/2023   10:56 AM 11/26/2022   10:29 AM 08/05/2022   11:16 AM 01/28/2022    7:45 AM 10/27/2021    7:33 AM  Fall Risk   Falls in the past year? 0 0 0 0 0  Number falls in past yr: 0  0 0 0  Injury with Fall? 0  0 0 0  Risk for fall due to : No Fall Risks No Fall Risks No Fall Risks No Fall Risks No Fall Risks  Follow up Falls prevention discussed Falls prevention discussed;Education provided;Falls evaluation completed Falls prevention discussed Falls prevention discussed Falls prevention discussed      Functional Status Survey: Is the patient deaf or have difficulty hearing?: No Does the patient have difficulty seeing, even when wearing glasses/contacts?: No Does the patient have difficulty concentrating, remembering, or making decisions?: No Does the patient have difficulty walking or climbing stairs?: No Does the patient have difficulty dressing or bathing?: No Does the patient have difficulty doing errands alone such as visiting a doctor's office or shopping?: No    Assessment & Plan   1. Dyslipidemia associated with type 2 diabetes mellitus (HCC)  - POCT HgB A1C  2. Dyslipidemia  - rosuvastatin (CRESTOR) 40 MG tablet; Take 1 tablet (40 mg total) by mouth daily.  Dispense: 90 tablet; Refill: 1  3. Diabetes mellitus type 2 wit microalbuminuria (HCC)  - Semaglutide (RYBELSUS) 7 MG TABS; Take 1 tablet (7 mg total) by mouth daily.  Dispense: 90 tablet; Refill: 1  4. Hypertension associated with chronic kidney disease due to type 2 diabetes mellitus (HCC)  - metoprolol succinate (TOPROL-XL) 100 MG 24 hr tablet; Take 1 tablet (100 mg total) by mouth daily. Take with or immediately following a meal.  Dispense: 90 tablet; Refill: 1 - hydrochlorothiazide (HYDRODIURIL) 12.5 MG tablet;  Take 1 tablet (12.5 mg total) by mouth daily.  Dispense: 90 tablet; Refill: 1 - amLODipine (NORVASC) 5 MG tablet; Take 1 tablet (5 mg total) by mouth daily.  Dispense: 90 tablet; Refill: 1  5. Acquired hypothyroidism  - levothyroxine (SYNTHROID) 50 MCG tablet; Take 1 tablet (50 mcg total) by mouth daily before breakfast.  Dispense: 90 tablet; Refill: 1

## 2023-03-10 ENCOUNTER — Ambulatory Visit: Payer: Managed Care, Other (non HMO) | Admitting: Family Medicine

## 2023-03-10 ENCOUNTER — Encounter: Payer: Self-pay | Admitting: Family Medicine

## 2023-03-10 VITALS — BP 132/70 | HR 83 | Resp 16 | Ht 62.0 in | Wt 180.0 lb

## 2023-03-10 DIAGNOSIS — R809 Proteinuria, unspecified: Secondary | ICD-10-CM

## 2023-03-10 DIAGNOSIS — E1122 Type 2 diabetes mellitus with diabetic chronic kidney disease: Secondary | ICD-10-CM | POA: Diagnosis not present

## 2023-03-10 DIAGNOSIS — E039 Hypothyroidism, unspecified: Secondary | ICD-10-CM

## 2023-03-10 DIAGNOSIS — E785 Hyperlipidemia, unspecified: Secondary | ICD-10-CM | POA: Diagnosis not present

## 2023-03-10 DIAGNOSIS — I129 Hypertensive chronic kidney disease with stage 1 through stage 4 chronic kidney disease, or unspecified chronic kidney disease: Secondary | ICD-10-CM

## 2023-03-10 DIAGNOSIS — E1129 Type 2 diabetes mellitus with other diabetic kidney complication: Secondary | ICD-10-CM

## 2023-03-10 DIAGNOSIS — E1169 Type 2 diabetes mellitus with other specified complication: Secondary | ICD-10-CM | POA: Diagnosis not present

## 2023-03-10 LAB — POCT GLYCOSYLATED HEMOGLOBIN (HGB A1C): Hemoglobin A1C: 6 % — AB (ref 4.0–5.6)

## 2023-03-10 MED ORDER — HYDROCHLOROTHIAZIDE 12.5 MG PO TABS: 12.5000 mg | ORAL_TABLET | Freq: Every day | ORAL | 1 refills | Status: DC

## 2023-03-10 MED ORDER — AMLODIPINE BESYLATE 5 MG PO TABS: 5.0000 mg | ORAL_TABLET | Freq: Every day | ORAL | 1 refills | Status: DC

## 2023-03-10 MED ORDER — ROSUVASTATIN CALCIUM 40 MG PO TABS: 40.0000 mg | ORAL_TABLET | Freq: Every day | ORAL | 1 refills | Status: DC

## 2023-03-10 MED ORDER — METOPROLOL SUCCINATE ER 100 MG PO TB24: 100.0000 mg | ORAL_TABLET | Freq: Every day | ORAL | 1 refills | Status: DC

## 2023-03-10 MED ORDER — RYBELSUS 7 MG PO TABS: 7.0000 mg | ORAL_TABLET | Freq: Every day | ORAL | 1 refills | Status: DC

## 2023-03-10 MED ORDER — LEVOTHYROXINE SODIUM 50 MCG PO TABS: 50.0000 ug | ORAL_TABLET | Freq: Every day | ORAL | 1 refills | Status: DC

## 2023-03-10 NOTE — Progress Notes (Deleted)
Name: Sherry Patel   MRN: [id number]    DOB: 1966-11-19   Date:03/10/2023       Progress Note  Subjective  Chief Complaint  Follow Up  HPI  DMII: last A1C in Dec 21 was 6.7 % down to  6.3 %, 6.1 % and last visit it was 5.9 %  She denies polyphagia, polydipsia or polyuria. She has a history of dyslipidemia, obesity and microalbuminuria. She cannot take ACE because of history of angioedema, unable to tolerate Jardiance caused sore throat, she has been on Rybelsus since Summer 2022 and is doing well. She states medication curbs her appetite, she does not want to go up on the dose  . Last LDL was very high and she has been taking Rosuvastatin 40 mg daily,  Hypothyroidism: she has been taking levothyroxine 50 mcg daily, last TSH was normal  Has had palpitations intermittently for many years - they occur when she's stressed but it is sporadic now - controlled with metoprolol .  She denies dysphagia, dry skin or change in bowel movement at this time    HTN: BP has been under control She is taking medication as prescribed , no side effects. She has CKI, but cannot take ACE/ARB due to history of angioedema, unable to tolerate Jardiance. We will continue Norvasc and Metoprolol  since she is tolerating medications well    CKD: Had elevated microalbumin in May 2019; kidney function has been stable, denies pruritis. We tried adding Jardiance on her last visit but unable to tolerate, she states it causes sore throat, she stopped and tried again a couple of weeks later and it happened again We are keeping diabetes and bp under control and we will continue to monitor it     Hyperlipidemia: she is back on Crestor and we will recheck labs    Obesity: She states since menopause she had  craving carbohydrates. She states weight used to be around 175 lbs but gradually gained weight over the past few years, she was 205 in May 2022 started on Rybelsus and she has lost 29 lbs , today's weight is 176 lbs, at this time  she does not want to go up on dose of Rybelsus   Rhinorrhea: she refused to get covid tested, advised to wear a mask, denies fatigue, sore throat, headaches. She is taking otc medications for now    Patient Active Problem List   Diagnosis Date Noted   Diabetes mellitus type 2 in obese ([place]) 07/25/2021   Prediabetes 12/04/2019   Pure hypercholesterolemia 12/04/2019   Microalbuminuria 05/23/2019   Acquired hypothyroidism 12/31/2015   IFG (impaired fasting glucose) 09/25/2015   Hypertensive chronic kidney disease 09/25/2015   Menopause 09/25/2015    Past Surgical History:  Procedure Laterality Date   breast biopy Left 20 years ago   BREAST EXCISIONAL BIOPSY Left    cyst removed    Family History  Problem Relation Age of Onset   Hypertension Mother    Hypertension Maternal Grandmother    Hypertension Maternal Grandfather     Social History   Tobacco Use   Smoking status: Former    Packs/day: 1    Types: Cigarettes    Start date: 01/11/1979    Quit date: 07/15/1983    Years since quitting: 39.6   Smokeless tobacco: Never  Substance Use Topics   Alcohol use: No     Current Outpatient Medications:    amLODipine (NORVASC) 5 MG tablet, TAKE 1 TABLET(5 MG) BY MOUTH DAILY,  Disp: 90 tablet, Rfl: 0   Ascorbic Acid (VITAMIN C) 100 MG tablet, Take 100 mg by mouth daily., Disp: , Rfl:    hydrochlorothiazide (HYDRODIURIL) 12.5 MG tablet, Take 1 tablet (12.5 mg total) by mouth daily., Disp: 90 tablet, Rfl: 1   levothyroxine (SYNTHROID) 50 MCG tablet, TAKE 1 TABLET(50 MCG) BY MOUTH DAILY BEFORE BREAKFAST, Disp: 90 tablet, Rfl: 0   metoprolol succinate (TOPROL-XL) 100 MG 24 hr tablet, TAKE 1 TABLET(100 MG) BY MOUTH DAILY WITH OR IMMEDIATELY FOLLOWING A MEAL, Disp: 90 tablet, Rfl: 0   Multiple Vitamins-Minerals (MULTIVITAMIN WITH MINERALS) tablet, Take 1 tablet by mouth daily., Disp: , Rfl:    Omega-3 Fatty Acids (FISH OIL) 1000 MG CAPS, Take by mouth., Disp: , Rfl:    rosuvastatin  (CRESTOR) 40 MG tablet, TAKE 1 TABLET(40 MG) BY MOUTH DAILY, Disp: 90 tablet, Rfl: 1   Semaglutide (RYBELSUS) 7 MG TABS, Take 7 mg by mouth daily., Disp: 90 tablet, Rfl: 1  Allergies  Allergen Reactions   Lisinopril Swelling    Angioedema in 2018    I personally reviewed active problem list, medication list, allergies, family history, social history, health maintenance with the patient/caregiver today.   ROS  ***  Objective  Vitals:   03/10/23 1055  BP: 132/70  Pulse: 83  Resp: 16  SpO2: 99%  Weight: 180 lb (81.6 kg)  Height: 5\' 2"  (1.575 m)    Body mass index is 32.92 kg/m.  Physical Exam ***  No results found for this or any previous visit (from the past 2160 hour(s)).   PHQ2/9:    03/10/2023   10:56 AM 11/26/2022   10:28 AM 08/05/2022   11:16 AM 01/28/2022    7:45 AM 10/27/2021    7:33 AM  Depression screen PHQ 2/9  Decreased Interest 0 0 0 0 0  Down, Depressed, Hopeless 1 0 0 0 0  PHQ - 2 Score 1 0 0 0 0  Altered sleeping 0 0 0 0 0  Tired, decreased energy 0 0 0 0 0  Change in appetite 0 0 0 0 0  Feeling bad or failure about yourself  0 0 0 0 0  Trouble concentrating 0 0 0 0 0  Moving slowly or fidgety/restless 0 0 0 0 0  Suicidal thoughts 0 0 0 0 0  PHQ-9 Score 1 0 0 0 0    phq 9 is {gen pos [id number]   Fall Risk:    03/10/2023   10:56 AM 11/26/2022   10:29 AM 08/05/2022   11:16 AM 01/28/2022    7:45 AM 10/27/2021    7:33 AM  Fall Risk   Falls in the past year? 0 0 0 0 0  Number falls in past yr: 0  0 0 0  Injury with Fall? 0  0 0 0  Risk for fall due to : No Fall Risks No Fall Risks No Fall Risks No Fall Risks No Fall Risks  Follow up Falls prevention discussed Falls prevention discussed;Education provided;Falls evaluation completed Falls prevention discussed Falls prevention discussed Falls prevention discussed      Functional Status Survey: Is the patient deaf or have difficulty hearing?: No Does the patient have difficulty seeing,  even when wearing glasses/contacts?: No Does the patient have difficulty concentrating, remembering, or making decisions?: No Does the patient have difficulty walking or climbing stairs?: No Does the patient have difficulty dressing or bathing?: No Does the patient have difficulty doing errands alone such as visiting a doctor's  office or shopping?: No    Assessment & Plan  *** There are no diagnoses linked to this encounter.

## 2023-04-24 ENCOUNTER — Other Ambulatory Visit: Payer: Self-pay | Admitting: Family Medicine

## 2023-04-24 DIAGNOSIS — E039 Hypothyroidism, unspecified: Secondary | ICD-10-CM

## 2023-04-26 ENCOUNTER — Other Ambulatory Visit: Payer: Self-pay

## 2023-04-26 DIAGNOSIS — E039 Hypothyroidism, unspecified: Secondary | ICD-10-CM

## 2023-05-31 ENCOUNTER — Other Ambulatory Visit: Payer: Self-pay | Admitting: Family Medicine

## 2023-05-31 DIAGNOSIS — E1122 Type 2 diabetes mellitus with diabetic chronic kidney disease: Secondary | ICD-10-CM

## 2023-08-01 ENCOUNTER — Other Ambulatory Visit: Payer: Self-pay | Admitting: Family Medicine

## 2023-08-01 DIAGNOSIS — E039 Hypothyroidism, unspecified: Secondary | ICD-10-CM

## 2023-08-09 NOTE — Progress Notes (Unsigned)
Name: Sherry Patel   MRN: [id number]    DOB: July 03, 1966   Date:08/10/2023       Progress Note  Subjective  Chief Complaint  Follow up  HPI  DMII: last A1C in Dec 21 was 6.7 % down to  6.3 %, 6.1 % 5.9 % ,6% and now is 6.2 %  She denies polyphagia, polydipsia or polyuria. She has a history of dyslipidemia, obesity and microalbuminuria. She cannot take ACE because of history of angioedema, unable to tolerate Jardiance caused sore throat, she has been on Rybelsus since Summer 2022 and no side effects  She states medication curbs her appetite, she does not want to go up on the dose. She is on statin for dyslipidemia., LDL was 198 and last time was down to 93   Hypothyroidism: she has been taking levothyroxine 50 mcg daily, last TSH was normal  Has had palpitations intermittently for many years - they occur when she's stressed but it is sporadic now - controlled with metoprolol .  She denies dysphagia, dry skin,  or change in bowel movement at this time . She has noticed some thinning of her hair. We will recheck labs today    HTN: BP is well controlled today.  She is taking medication as prescribed  She has CKI, but cannot take ACE/ARB due to history of angioedema, unable to tolerate Jardiance. We will continue Norvasc, HCTZ  and Metoprolol    CKD stage I with microalbuminuria: she had  elevated microalbumin in May 2019; kidney function has been stable, denies pruritis. We tried adding Jardiance on her last visit but unable to tolerate, she states it causes sore throat, she stopped and tried again a couple of weeks later and it happened again We are keeping diabetes and bp under control and we will continue to monitor it . Normal urine output    Hyperlipidemia: she is back on Crestor and last LDL was good , down over 50 %  and is time to recheck    Obesity: She states since menopause she had  craving carbohydrates. She states weight used to be around 175 lbs but gradually gained weight over the past  few years, she was 205 in May 2022 started on Rybelsus and she had  lost 29 lbs, it was down to 176 lbs, weight has been stable now at 180 lbs for over 6 months . She wants to lose more weight but does not want to adjust dose of Rybelsus   Patient Active Problem List   Diagnosis Date Noted   Dyslipidemia associated with type 2 diabetes mellitus (HCC) 07/25/2021   Prediabetes 12/04/2019   Pure hypercholesterolemia 12/04/2019   Microalbuminuria 05/23/2019   Acquired hypothyroidism 12/31/2015   Hypertension associated with chronic kidney disease due to type 2 diabetes mellitus (HCC) 09/25/2015   Hypertensive chronic kidney disease 09/25/2015   Menopause 09/25/2015    Past Surgical History:  Procedure Laterality Date   breast biopy Left 20 years ago   BREAST EXCISIONAL BIOPSY Left    cyst removed    Family History  Problem Relation Age of Onset   Hypertension Mother    Hypertension Maternal Grandmother    Hypertension Maternal Grandfather     Social History   Tobacco Use   Smoking status: Former    Current packs/day: 0.00    Average packs/day: 1 pack/day for 4.5 years (4.5 ttl pk-yrs)    Types: Cigarettes    Start date: 01/11/1979    Quit date: 07/15/1983  Years since quitting: 40.0   Smokeless tobacco: Never  Substance Use Topics   Alcohol use: No     Current Outpatient Medications:    Ascorbic Acid (VITAMIN C) 100 MG tablet, Take 100 mg by mouth daily., Disp: , Rfl:    levothyroxine (SYNTHROID) 50 MCG tablet, TAKE 1 TABLET(50 MCG) BY MOUTH DAILY BEFORE BREAKFAST, Disp: 30 tablet, Rfl: 0   Multiple Vitamins-Minerals (MULTIVITAMIN WITH MINERALS) tablet, Take 1 tablet by mouth daily., Disp: , Rfl:    Omega-3 Fatty Acids (FISH OIL) 1000 MG CAPS, Take by mouth., Disp: , Rfl:    amLODipine (NORVASC) 5 MG tablet, Take 1 tablet (5 mg total) by mouth daily., Disp: 90 tablet, Rfl: 1   hydrochlorothiazide (HYDRODIURIL) 12.5 MG tablet, Take 1 tablet (12.5 mg total) by mouth daily.,  Disp: 90 tablet, Rfl: 1   metoprolol succinate (TOPROL-XL) 100 MG 24 hr tablet, Take 1 tablet (100 mg total) by mouth daily. Take with or immediately following a meal., Disp: 90 tablet, Rfl: 1   rosuvastatin (CRESTOR) 40 MG tablet, Take 1 tablet (40 mg total) by mouth daily., Disp: 90 tablet, Rfl: 1   Semaglutide (RYBELSUS) 7 MG TABS, Take 1 tablet (7 mg total) by mouth daily., Disp: 90 tablet, Rfl: 1  Allergies  Allergen Reactions   Lisinopril Swelling    Angioedema in 2018    I personally reviewed active problem list, medication list, allergies, family history, social history with the patient/caregiver today.   ROS  Constitutional: Negative for fever or weight change.  Respiratory: Negative for cough and shortness of breath.   Cardiovascular: Negative for chest pain or palpitations.  Gastrointestinal: Negative for abdominal pain, no bowel changes.  Musculoskeletal: Negative for gait problem or joint swelling.  Skin: Negative for rash.  Neurological: Negative for dizziness or headache.  No other specific complaints in a complete review of systems (except as listed in HPI above).    Objective  Vitals:   08/10/23 0913  BP: 128/72  Pulse: 71  Resp: 14  Temp: 97.8 F (36.6 C)  TempSrc: Oral  SpO2: 98%  Weight: 180 lb 3.2 oz (81.7 kg)  Height: 5\' 2"  (1.575 m)    Body mass index is 32.96 kg/m.  Physical Exam  Constitutional: Patient appears well-developed and well-nourished. Obese  No distress.  HEENT: head atraumatic, normocephalic, pupils equal and reactive to light, neck supple Cardiovascular: Normal rate, regular rhythm and normal heart sounds.  No murmur heard. No BLE edema. Pulmonary/Chest: Effort normal and breath sounds normal. No respiratory distress. Abdominal: Soft.  There is no tenderness. Psychiatric: Patient has a normal mood and affect. behavior is normal. Judgment and thought content normal.   Recent Results (from the past 2160 hour(s))  POCT HgB A1C      Status: Abnormal   Collection Time: 08/10/23  9:16 AM  Result Value Ref Range   Hemoglobin A1C 6.2 (A) 4.0 - 5.6 %   HbA1c POC (<> result, manual entry)     HbA1c, POC (prediabetic range)     HbA1c, POC (controlled diabetic range)      Diabetic Foot Exam: Diabetic Foot Exam - Simple   Simple Foot Form Visual Inspection No deformities, no ulcerations, no other skin breakdown bilaterally: Yes Sensation Testing Intact to touch and monofilament testing bilaterally: Yes Pulse Check Posterior Tibialis and Dorsalis pulse intact bilaterally: Yes Comments      PHQ2/9:    08/10/2023    9:14 AM 03/10/2023   10:56 AM 11/26/2022  10:28 AM 08/05/2022   11:16 AM 01/28/2022    7:45 AM  Depression screen PHQ 2/9  Decreased Interest 0 0 0 0 0  Down, Depressed, Hopeless 0 1 0 0 0  PHQ - 2 Score 0 1 0 0 0  Altered sleeping 0 0 0 0 0  Tired, decreased energy 0 0 0 0 0  Change in appetite 0 0 0 0 0  Feeling bad or failure about yourself  0 0 0 0 0  Trouble concentrating 0 0 0 0 0  Moving slowly or fidgety/restless 0 0 0 0 0  Suicidal thoughts 0 0 0 0 0  PHQ-9 Score 0 1 0 0 0    phq 9 is negative   Fall Risk:    08/10/2023    9:14 AM 03/10/2023   10:56 AM 11/26/2022   10:29 AM 08/05/2022   11:16 AM 01/28/2022    7:45 AM  Fall Risk   Falls in the past year? 0 0 0 0 0  Number falls in past yr:  0  0 0  Injury with Fall?  0  0 0  Risk for fall due to : No Fall Risks No Fall Risks No Fall Risks No Fall Risks No Fall Risks  Follow up Falls prevention discussed Falls prevention discussed Falls prevention discussed;Education provided;Falls evaluation completed Falls prevention discussed Falls prevention discussed    Functional Status Survey: Is the patient deaf or have difficulty hearing?: No Does the patient have difficulty seeing, even when wearing glasses/contacts?: No Does the patient have difficulty concentrating, remembering, or making decisions?: No Does the patient have  difficulty walking or climbing stairs?: No Does the patient have difficulty dressing or bathing?: No Does the patient have difficulty doing errands alone such as visiting a doctor's office or shopping?: No    Assessment & Plan  1. Dyslipidemia associated with type 2 diabetes mellitus (HCC)  - HM Diabetes Foot Exam - POCT HgB A1C - Urine Microalbumin w/creat. ratio - Lipid panel  2. Controlled type 2 diabetes mellitus with microalbuminuria, without long-term current use of insulin (HCC)  - HM Diabetes Foot Exam - POCT HgB A1C - Urine Microalbumin w/creat. ratio - Semaglutide (RYBELSUS) 7 MG TABS; Take 1 tablet (7 mg total) by mouth daily.  Dispense: 90 tablet; Refill: 1  3. Breast cancer screening by mammogram  - MM 3D SCREENING MAMMOGRAM BILATERAL BREAST; Future  4. Acquired hypothyroidism  - TSH  5. Hypertension associated with chronic kidney disease due to type 2 diabetes mellitus (HCC)  - CBC with Differential/Platelet - COMPLETE METABOLIC PANEL WITH GFR - amLODipine (NORVASC) 5 MG tablet; Take 1 tablet (5 mg total) by mouth daily.  Dispense: 90 tablet; Refill: 1 - hydrochlorothiazide (HYDRODIURIL) 12.5 MG tablet; Take 1 tablet (12.5 mg total) by mouth daily.  Dispense: 90 tablet; Refill: 1 - metoprolol succinate (TOPROL-XL) 100 MG 24 hr tablet; Take 1 tablet (100 mg total) by mouth daily. Take with or immediately following a meal.  Dispense: 90 tablet; Refill: 1  6. Microalbuminuria  Urine micro   7. Vitamin D deficiency  - VITAMIN D 25 Hydroxy (Vit-D Deficiency, Fractures)  8. Dyslipidemia  - rosuvastatin (CRESTOR) 40 MG tablet; Take 1 tablet (40 mg total) by mouth daily.  Dispense: 90 tablet; Refill: 1

## 2023-08-10 ENCOUNTER — Ambulatory Visit (INDEPENDENT_AMBULATORY_CARE_PROVIDER_SITE_OTHER): Payer: Managed Care, Other (non HMO) | Admitting: Family Medicine

## 2023-08-10 ENCOUNTER — Encounter: Payer: Self-pay | Admitting: Family Medicine

## 2023-08-10 VITALS — BP 128/72 | HR 71 | Temp 97.8°F | Resp 14 | Ht 62.0 in | Wt 180.2 lb

## 2023-08-10 DIAGNOSIS — R809 Proteinuria, unspecified: Secondary | ICD-10-CM | POA: Diagnosis not present

## 2023-08-10 DIAGNOSIS — E559 Vitamin D deficiency, unspecified: Secondary | ICD-10-CM

## 2023-08-10 DIAGNOSIS — E785 Hyperlipidemia, unspecified: Secondary | ICD-10-CM | POA: Diagnosis not present

## 2023-08-10 DIAGNOSIS — E039 Hypothyroidism, unspecified: Secondary | ICD-10-CM | POA: Diagnosis not present

## 2023-08-10 DIAGNOSIS — E1129 Type 2 diabetes mellitus with other diabetic kidney complication: Secondary | ICD-10-CM

## 2023-08-10 DIAGNOSIS — E1169 Type 2 diabetes mellitus with other specified complication: Secondary | ICD-10-CM

## 2023-08-10 DIAGNOSIS — Z1231 Encounter for screening mammogram for malignant neoplasm of breast: Secondary | ICD-10-CM

## 2023-08-10 DIAGNOSIS — E1122 Type 2 diabetes mellitus with diabetic chronic kidney disease: Secondary | ICD-10-CM

## 2023-08-10 DIAGNOSIS — Z7984 Long term (current) use of oral hypoglycemic drugs: Secondary | ICD-10-CM

## 2023-08-10 DIAGNOSIS — I129 Hypertensive chronic kidney disease with stage 1 through stage 4 chronic kidney disease, or unspecified chronic kidney disease: Secondary | ICD-10-CM

## 2023-08-10 LAB — POCT GLYCOSYLATED HEMOGLOBIN (HGB A1C): Hemoglobin A1C: 6.2 % — AB (ref 4.0–5.6)

## 2023-08-10 MED ORDER — METOPROLOL SUCCINATE ER 100 MG PO TB24
100.0000 mg | ORAL_TABLET | Freq: Every day | ORAL | 1 refills | Status: DC
Start: 2023-08-10 — End: 2023-12-20

## 2023-08-10 MED ORDER — ROSUVASTATIN CALCIUM 40 MG PO TABS
40.0000 mg | ORAL_TABLET | Freq: Every day | ORAL | 1 refills | Status: DC
Start: 2023-08-10 — End: 2024-02-28

## 2023-08-10 MED ORDER — HYDROCHLOROTHIAZIDE 12.5 MG PO TABS
12.5000 mg | ORAL_TABLET | Freq: Every day | ORAL | 1 refills | Status: DC
Start: 2023-08-10 — End: 2024-02-28

## 2023-08-10 MED ORDER — AMLODIPINE BESYLATE 5 MG PO TABS
5.0000 mg | ORAL_TABLET | Freq: Every day | ORAL | 1 refills | Status: DC
Start: 2023-08-10 — End: 2024-02-28

## 2023-08-10 MED ORDER — RYBELSUS 7 MG PO TABS
7.0000 mg | ORAL_TABLET | Freq: Every day | ORAL | 1 refills | Status: DC
Start: 2023-08-10 — End: 2024-02-28

## 2023-08-10 NOTE — Addendum Note (Signed)
Addended by: Benay Pike on: 08/10/2023 10:22 AM   Modules accepted: Orders

## 2023-08-11 LAB — CBC WITH DIFFERENTIAL/PLATELET
Basophils Absolute: 0 10*3/uL (ref 0.0–0.2)
Basos: 0 %
EOS (ABSOLUTE): 0.1 10*3/uL (ref 0.0–0.4)
Eos: 1 %
Hematocrit: 40.7 % (ref 34.0–46.6)
Hemoglobin: 13.4 g/dL (ref 11.1–15.9)
Immature Grans (Abs): 0 10*3/uL (ref 0.0–0.1)
Immature Granulocytes: 0 %
Lymphocytes Absolute: 2.5 10*3/uL (ref 0.7–3.1)
Lymphs: 32 %
MCH: 29.7 pg (ref 26.6–33.0)
MCHC: 32.9 g/dL (ref 31.5–35.7)
MCV: 90 fL (ref 79–97)
Monocytes Absolute: 0.6 10*3/uL (ref 0.1–0.9)
Monocytes: 8 %
Neutrophils Absolute: 4.6 10*3/uL (ref 1.4–7.0)
Neutrophils: 59 %
Platelets: 321 10*3/uL (ref 150–450)
RBC: 4.51 x10E6/uL (ref 3.77–5.28)
RDW: 13.5 % (ref 11.7–15.4)
WBC: 7.9 10*3/uL (ref 3.4–10.8)

## 2023-08-11 LAB — CMP14+EGFR
ALT: 12 IU/L (ref 0–32)
AST: 12 IU/L (ref 0–40)
Albumin: 4.5 g/dL (ref 3.8–4.9)
Alkaline Phosphatase: 76 IU/L (ref 44–121)
BUN/Creatinine Ratio: 16 (ref 9–23)
BUN: 13 mg/dL (ref 6–24)
Bilirubin Total: 0.2 mg/dL (ref 0.0–1.2)
CO2: 28 mmol/L (ref 20–29)
Calcium: 10 mg/dL (ref 8.7–10.2)
Chloride: 100 mmol/L (ref 96–106)
Creatinine, Ser: 0.83 mg/dL (ref 0.57–1.00)
Globulin, Total: 3.1 g/dL (ref 1.5–4.5)
Glucose: 82 mg/dL (ref 70–99)
Potassium: 4.2 mmol/L (ref 3.5–5.2)
Sodium: 139 mmol/L (ref 134–144)
Total Protein: 7.6 g/dL (ref 6.0–8.5)
eGFR: 81 mL/min/{1.73_m2} (ref >59)

## 2023-08-11 LAB — MICROALBUMIN / CREATININE URINE RATIO
Creatinine, Urine: 92.9 mg/dL
Microalb/Creat Ratio: 7 mg/g{creat} (ref 0–29)
Microalbumin, Urine: 6.2 ug/mL

## 2023-08-11 LAB — LIPID PANEL
Chol/HDL Ratio: 2.9 ratio (ref 0.0–4.4)
Cholesterol, Total: 146 mg/dL (ref 100–199)
HDL: 50 mg/dL (ref >39)
LDL Chol Calc (NIH): 82 mg/dL (ref 0–99)
Triglycerides: 71 mg/dL (ref 0–149)
VLDL Cholesterol Cal: 14 mg/dL (ref 5–40)

## 2023-08-11 LAB — VITAMIN D 25 HYDROXY (VIT D DEFICIENCY, FRACTURES): Vit D, 25-Hydroxy: 50.1 ng/mL (ref 30.0–100.0)

## 2023-08-11 LAB — TSH: TSH: 1.25 u[IU]/mL (ref 0.450–4.500)

## 2023-09-06 ENCOUNTER — Other Ambulatory Visit: Payer: Self-pay | Admitting: Family Medicine

## 2023-09-06 DIAGNOSIS — E785 Hyperlipidemia, unspecified: Secondary | ICD-10-CM

## 2023-09-06 DIAGNOSIS — E1122 Type 2 diabetes mellitus with diabetic chronic kidney disease: Secondary | ICD-10-CM

## 2023-09-21 ENCOUNTER — Ambulatory Visit: Payer: Managed Care, Other (non HMO)

## 2023-10-12 ENCOUNTER — Ambulatory Visit
Admission: RE | Admit: 2023-10-12 | Discharge: 2023-10-12 | Disposition: A | Discharge status: Home / Self Care | Payer: Managed Care, Other (non HMO) | Source: Ambulatory Visit | Attending: Family Medicine | Admitting: Family Medicine

## 2023-10-12 DIAGNOSIS — Z1231 Encounter for screening mammogram for malignant neoplasm of breast: Secondary | ICD-10-CM | POA: Insufficient documentation

## 2023-10-15 ENCOUNTER — Other Ambulatory Visit: Payer: Self-pay | Admitting: Family Medicine

## 2023-10-15 DIAGNOSIS — R928 Other abnormal and inconclusive findings on diagnostic imaging of breast: Secondary | ICD-10-CM

## 2023-10-19 ENCOUNTER — Ambulatory Visit
Admission: RE | Admit: 2023-10-19 | Discharge: 2023-10-19 | Disposition: A | Discharge status: Home / Self Care | Payer: Managed Care, Other (non HMO) | Source: Ambulatory Visit | Attending: Family Medicine | Admitting: Family Medicine

## 2023-10-19 DIAGNOSIS — R928 Other abnormal and inconclusive findings on diagnostic imaging of breast: Secondary | ICD-10-CM | POA: Insufficient documentation

## 2023-11-19 NOTE — Progress Notes (Signed)
Name: Sherry Patel   MRN: [id number]    DOB: February 08, 1966   Date:12/01/2023       Progress Note  Subjective  Chief Complaint  Chief Complaint  Patient presents with   Annual Exam    HPI  Patient presents for annual CPE.  Diet: skips meals but eats healthy meals and packs her lunch Exercise:  she walks 3 days a week for 10 minutes  Last Eye Exam: due for a visit, she will schedule it  Last Dental Exam: up to date   Flowsheet Row Office Visit from 12/01/2023 in [facility]  AUDIT-C Score 1      Depression: Phq 9 is  negative    08/10/2023    9:14 AM 03/10/2023   10:56 AM 11/26/2022   10:28 AM 08/05/2022   11:16 AM 01/28/2022    7:45 AM  Depression screen PHQ 2/9  Decreased Interest 0 0 0 0 0  Down, Depressed, Hopeless 0 1 0 0 0  PHQ - 2 Score 0 1 0 0 0  Altered sleeping 0 0 0 0 0  Tired, decreased energy 0 0 0 0 0  Change in appetite 0 0 0 0 0  Feeling bad or failure about yourself  0 0 0 0 0  Trouble concentrating 0 0 0 0 0  Moving slowly or fidgety/restless 0 0 0 0 0  Suicidal thoughts 0 0 0 0 0  PHQ-9 Score 0 1 0 0 0   Hypertension: BP Readings from Last 3 Encounters:  12/01/23 134/76  08/10/23 128/72  03/10/23 132/70   Obesity: Wt Readings from Last 3 Encounters:  12/01/23 183 lb 9.6 oz (83.3 kg)  08/10/23 180 lb 3.2 oz (81.7 kg)  03/10/23 180 lb (81.6 kg)   BMI Readings from Last 3 Encounters:  12/01/23 33.58 kg/m  08/10/23 32.96 kg/m  03/10/23 32.92 kg/m     Vaccines:    Tdap: 07/25/21 Shingrix: she had it at local pharmacy  Pneumonia: Up to date  Flu: due /ordered COVID-19:3 doses   Hep C Screening: complete STD testing and prevention (HIV/chl/gon/syphilis): HIV complete Intimate partner violence: negative screen  Sexual History : not currently sexually active  Menstrual History/LMP/Abnormal Bleeding:post menopausal since her early 12's  Discussed importance of follow up if any post-menopausal bleeding: yes   Incontinence Symptoms: negative for symptoms   Breast cancer:  - Last Mammogram: 10/12/23 - BRCA gene screening: N/A  Osteoporosis Prevention : Discussed high calcium and vitamin D supplementation, weight bearing exercises Bone density :not applicable   Cervical cancer screening: 11/26/22 HPV positive repeat this yr   Skin cancer: Discussed monitoring for atypical lesions  Colorectal cancer: 06/10/19 cologuard Lung cancer:  Low Dose CT Chest recommended if Age 90-80 years, 20 pack-year currently smoking OR have quit w/in 15years. Patient does not qualify for screen   ECG: 05/23/19  Advanced Care Planning: A voluntary discussion about advance care planning including the explanation and discussion of advance directives.  Discussed health care proxy and Living will, and the patient was able to identify a health care proxy as son - Tyrell.  Patient does not have a living will and power of attorney of health care   Lipids: Lab Results  Component Value Date   CHOL 146 08/10/2023   CHOL 161 08/05/2022   CHOL 132 01/28/2022   Lab Results  Component Value Date   HDL 50 08/10/2023   HDL 52 08/05/2022   HDL 39 (L) 01/28/2022  Lab Results  Component Value Date   LDLCALC 82 08/10/2023   LDLCALC 93 08/05/2022   LDLCALC 78 01/28/2022   Lab Results  Component Value Date   TRIG 71 08/10/2023   TRIG 88 08/05/2022   TRIG 70 01/28/2022   Lab Results  Component Value Date   CHOLHDL 2.9 08/10/2023   CHOLHDL 3.1 08/05/2022   CHOLHDL 3.4 01/28/2022   No results found for: "LDLDIRECT"  Glucose: Glucose  Date Value Ref Range Status  08/10/2023 82 70 - 99 mg/dL Final  16/09/9603 93 70 - 99 mg/dL Final  54/08/8118 84 70 - 99 mg/dL Final   Glucose, Bld  Date Value Ref Range Status  01/28/2022 86 65 - 99 mg/dL Final    Comment:    .            Fasting reference interval .   12/04/2019 82 65 - 99 mg/dL Final    Comment:    .            Fasting reference interval .      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) 09/25/2015   Hypertensive chronic kidney disease 09/25/2015   Menopause 09/25/2015    Past Surgical History:  Procedure Laterality Date   breast biopy Left 20 years ago   BREAST EXCISIONAL BIOPSY Left    cyst removed    Family History  Problem Relation Age of Onset   Hypertension Mother    Hypertension Maternal Grandmother    Hypertension Maternal Grandfather    Breast cancer Neg Hx     Social History   Socioeconomic History   Marital status: Legally Separated    Spouse name: Not on file   Number of children: 4   Years of education: Not on file   Highest education level: High school graduate  Occupational History   Occupation: Accounts payable    Employer: [employer]  Tobacco Use   Smoking status: Former    Current packs/day: 0.00    Average packs/day: 1 pack/day for 4.5 years (4.5 ttl pk-yrs)    Types: Cigarettes    Start date: 01/11/1979    Quit date: 07/15/1983    Years since quitting: 40.4   Smokeless tobacco: Never  Vaping Use   Vaping status: Never Used  Substance and Sexual Activity   Alcohol use: Yes    Comment: occ wine   Drug use: No   Sexual activity: Not Currently    Partners: Male    Birth control/protection: Post-menopausal  Other Topics Concern   Not on file  Social History Narrative   Not on file   Social Drivers of Health   Financial Resource Strain: Low Risk  (12/01/2023)   Overall Financial Resource Strain (CARDIA)    Difficulty of Paying Living Expenses: Not hard at all  Food Insecurity: No Food Insecurity (12/01/2023)   Hunger Vital Sign    Worried About Running Out of Food in the Last Year: Never true    Ran Out  of Food in the Last Year: Never true  Transportation Needs: No Transportation Needs (12/01/2023)   PRAPARE - Administrator, Civil Service (Medical): No    Lack of Transportation (Non-Medical): No  Physical Activity: Insufficiently Active (12/01/2023)  Exercise Vital Sign    Days of Exercise per Week: 3 days    Minutes of Exercise per Session: 40 min  Stress: No Stress Concern Present (12/01/2023)   [employer] of Occupational Health - Occupational Stress Questionnaire    Feeling of Stress : Only a little  Social Connections: Socially Integrated (12/01/2023)   Social Connection and Isolation Panel [NHANES]    Frequency of Communication with Friends and Family: More than three times a week    Frequency of Social Gatherings with Friends and Family: More than three times a week    Attends Religious Services: More than 4 times per year    Active Member of [employer] or Organizations: No    Attends Engineer, structural: 1 to 4 times per year    Marital Status: Married  Catering manager Violence: Not At Risk (12/01/2023)   Humiliation, Afraid, Rape, and Kick questionnaire    Fear of Current or Ex-Partner: No    Emotionally Abused: No    Physically Abused: No    Sexually Abused: No     Current Outpatient Medications:    amLODipine (NORVASC) 5 MG tablet, Take 1 tablet (5 mg total) by mouth daily., Disp: 90 tablet, Rfl: 1   Ascorbic Acid (VITAMIN C) 100 MG tablet, Take 100 mg by mouth daily., Disp: , Rfl:    hydrochlorothiazide (HYDRODIURIL) 12.5 MG tablet, Take 1 tablet (12.5 mg total) by mouth daily., Disp: 90 tablet, Rfl: 1   levothyroxine (SYNTHROID) 50 MCG tablet, TAKE 1 TABLET(50 MCG) BY MOUTH DAILY BEFORE BREAKFAST, Disp: 30 tablet, Rfl: 0   metoprolol succinate (TOPROL-XL) 100 MG 24 hr tablet, Take 1 tablet (100 mg total) by mouth daily. Take with or immediately following a meal., Disp: 90 tablet, Rfl: 1   Multiple Vitamins-Minerals (MULTIVITAMIN WITH MINERALS)  tablet, Take 1 tablet by mouth daily., Disp: , Rfl:    Omega-3 Fatty Acids (FISH OIL) 1000 MG CAPS, Take by mouth., Disp: , Rfl:    rosuvastatin (CRESTOR) 40 MG tablet, Take 1 tablet (40 mg total) by mouth daily., Disp: 90 tablet, Rfl: 1   Semaglutide (RYBELSUS) 7 MG TABS, Take 1 tablet (7 mg total) by mouth daily., Disp: 90 tablet, Rfl: 1  Allergies  Allergen Reactions   Lisinopril Swelling    Angioedema in 2018     ROS  Constitutional: Negative for fever or weight change.  Respiratory: Negative for cough and shortness of breath.   Cardiovascular: Negative for chest pain or palpitations.  Gastrointestinal: Negative for abdominal pain, no bowel changes.  Musculoskeletal: Negative for gait problem or joint swelling.  Skin: Negative for rash.  Neurological: Negative for dizziness or headache.  No other specific complaints in a complete review of systems (except as listed in HPI above).   Objective  Vitals:   12/01/23 0816  BP: 134/76  Pulse: 76  Resp: 16  Temp: 97.9 F (36.6 C)  TempSrc: Oral  SpO2: 99%  Weight: 183 lb 9.6 oz (83.3 kg)  Height: 5\' 2"  (1.575 m)    Body mass index is 33.58 kg/m.  Physical Exam  Constitutional: Patient appears well-developed and well-nourished. No distress.  HENT: Head: Normocephalic and atraumatic. Ears: B TMs ok, no erythema or effusion; Nose: Nose normal. Mouth/Throat: Oropharynx is clear and moist. No oropharyngeal exudate.  Eyes: Conjunctivae and EOM are normal. Pupils are equal, round, and reactive to light. No scleral icterus.  Neck: Normal range of motion. Neck supple. No JVD present. No thyromegaly present.  Cardiovascular:  Normal rate, regular rhythm and normal heart sounds.  No murmur heard. No BLE edema. Pulmonary/Chest: Effort normal and breath sounds normal. No respiratory distress. Abdominal: Soft. Bowel sounds are normal, no distension. There is no tenderness. no masses Breast: no lumps or masses, no nipple discharge or  rashes FEMALE GENITALIA:  External genitalia normal External urethra normal Vaginal vault normal without discharge or lesions Cervix normal without discharge or lesions Bimanual exam normal without masses RECTAL: not done Musculoskeletal: Normal range of motion, no joint effusions. No gross deformities Neurological: he is alert and oriented to person, place, and time. No cranial nerve deficit. Coordination, balance, strength, speech and gait are normal.  Skin: Skin is warm and dry. No rash noted. No erythema.  Psychiatric: Patient has a normal mood and affect. behavior is normal. Judgment and thought content normal.    Fall Risk:    12/01/2023    8:13 AM 08/10/2023    9:14 AM 03/10/2023   10:56 AM 11/26/2022   10:29 AM 08/05/2022   11:16 AM  Fall Risk   Falls in the past year? 0 0 0 0 0  Number falls in past yr: 0  0  0  Injury with Fall? 0  0  0  Risk for fall due to : No Fall Risks No Fall Risks No Fall Risks No Fall Risks No Fall Risks  Follow up Falls prevention discussed;Education provided;Falls evaluation completed Falls prevention discussed Falls prevention discussed Falls prevention discussed;Education provided;Falls evaluation completed Falls prevention discussed    Assessment & Plan  1. Well adult exam (Primary)  - Cytology - PAP - Cologuard  2. Need for influenza vaccination  - Flu vaccine trivalent PF, 6mos and older(Flulaval,Afluria,Fluarix,Fluzone)  3. Screening for cervical cancer  - Cytology - PAP  4. Cervical high risk HPV (human papillomavirus) test positive  - Cytology - PAP  5. Colon cancer screening  - Cologuard    There are no diagnoses linked to this encounter.-USPSTF grade A and B recommendations reviewed with patient; age-appropriate recommendations, preventive care, screening tests, etc discussed and encouraged; healthy living encouraged; see AVS for patient education given to patient -Discussed importance of 150 minutes of physical  activity weekly, eat two servings of fish weekly, eat one serving of tree nuts ( cashews, pistachios, pecans, almonds.Marland Kitchen) every other day, eat 6 servings of fruit/vegetables daily and drink plenty of water and avoid sweet beverages.   -Reviewed Health Maintenance: Yes.

## 2023-12-01 ENCOUNTER — Ambulatory Visit: Payer: Managed Care, Other (non HMO) | Admitting: Family Medicine

## 2023-12-01 ENCOUNTER — Other Ambulatory Visit (HOSPITAL_COMMUNITY)
Admission: RE | Admit: 2023-12-01 | Discharge: 2023-12-01 | Disposition: A | Discharge status: Home / Self Care | Payer: Managed Care, Other (non HMO) | Source: Ambulatory Visit | Attending: Family Medicine | Admitting: Family Medicine

## 2023-12-01 ENCOUNTER — Encounter: Payer: Self-pay | Admitting: Family Medicine

## 2023-12-01 VITALS — BP 134/76 | HR 76 | Temp 97.9°F | Resp 16 | Ht 62.0 in | Wt 183.6 lb

## 2023-12-01 DIAGNOSIS — R87810 Cervical high risk human papillomavirus (HPV) DNA test positive: Secondary | ICD-10-CM

## 2023-12-01 DIAGNOSIS — Z23 Encounter for immunization: Secondary | ICD-10-CM | POA: Diagnosis not present

## 2023-12-01 DIAGNOSIS — Z124 Encounter for screening for malignant neoplasm of cervix: Secondary | ICD-10-CM

## 2023-12-01 DIAGNOSIS — Z0001 Encounter for general adult medical examination with abnormal findings: Secondary | ICD-10-CM | POA: Diagnosis not present

## 2023-12-01 DIAGNOSIS — Z0000 Encounter for general adult medical examination without abnormal findings: Secondary | ICD-10-CM | POA: Insufficient documentation

## 2023-12-01 DIAGNOSIS — Z1211 Encounter for screening for malignant neoplasm of colon: Secondary | ICD-10-CM

## 2023-12-03 LAB — CYTOLOGY - PAP
Comment: NEGATIVE
Diagnosis: NEGATIVE
High risk HPV: NEGATIVE

## 2023-12-16 LAB — COLOGUARD: COLOGUARD: NEGATIVE

## 2023-12-20 ENCOUNTER — Other Ambulatory Visit: Payer: Self-pay | Admitting: Family Medicine

## 2023-12-20 DIAGNOSIS — E1122 Type 2 diabetes mellitus with diabetic chronic kidney disease: Secondary | ICD-10-CM

## 2023-12-20 NOTE — Telephone Encounter (Signed)
 Medication Refill -  Most Recent Primary Care Visit:  Provider: GLENARD MIRE  Department: [facility]  Visit Type: PHYSICAL  Date: 12/01/2023  Medication: metoprolol  succinate (TOPROL -XL) 100 MG 24 hr tablet   Has the patient contacted their pharmacy? Yes (Agent: If no, request that the patient contact the pharmacy for the refill. If patient does not wish to contact the pharmacy document the reason why and proceed with request.) (Agent: If yes, when and what did the pharmacy advise?)  Is this the correct pharmacy for this prescription? Yes If no, delete pharmacy and type the correct one.  This is the patient's preferred pharmacy:  [id number] [place], [place] - [address] AT [facility] OF [address] [place] [address] Phone: [phone] Fax: [phone]   Has the prescription been filled recently? Yes  Is the patient out of the medication? Yes  Has the patient been seen for an appointment in the last year OR does the patient have an upcoming appointment? Yes  Can we respond through MyChart? Yes  Agent: Please be advised that Rx refills may take up to 3 business days. We ask that you follow-up with your pharmacy.

## 2023-12-22 MED ORDER — METOPROLOL SUCCINATE ER 100 MG PO TB24: 100.0000 mg | ORAL_TABLET | Freq: Every day | ORAL | 1 refills | Status: DC

## 2023-12-22 NOTE — Telephone Encounter (Signed)
 Requested Prescriptions  Pending Prescriptions Disp Refills   metoprolol  succinate (TOPROL -XL) 100 MG 24 hr tablet 90 tablet 1    Sig: Take 1 tablet (100 mg total) by mouth daily. Take with or immediately following a meal.     Cardiovascular:  Beta Blockers Passed - 12/22/2023  2:01 PM      Passed - Last BP in normal range    BP Readings from Last 1 Encounters:  12/01/23 134/76         Passed - Last Heart Rate in normal range    Pulse Readings from Last 1 Encounters:  12/01/23 76         Passed - Valid encounter within last 6 months    Recent Outpatient Visits           3 weeks ago Well adult exam   [facility] Glenard Mire, MD   4 months ago Dyslipidemia associated with type 2 diabetes mellitus [facility])   [facility] Glenard Mire, MD   9 months ago Dyslipidemia associated with type 2 diabetes mellitus [facility])    [facility] Sowles, Krichna, MD   1 year ago Well adult exam   [facility] Sowles, Krichna, MD   1 year ago Dyslipidemia associated with type 2 diabetes mellitus [facility])    [facility] Sowles, Krichna, MD       Future Appointments             In 1 month Glenard, Krichna, MD [facility]

## 2024-02-06 ENCOUNTER — Other Ambulatory Visit: Payer: Self-pay | Admitting: Family Medicine

## 2024-02-06 DIAGNOSIS — E039 Hypothyroidism, unspecified: Secondary | ICD-10-CM

## 2024-02-07 ENCOUNTER — Other Ambulatory Visit: Payer: Self-pay | Admitting: Family Medicine

## 2024-02-07 DIAGNOSIS — E039 Hypothyroidism, unspecified: Secondary | ICD-10-CM

## 2024-02-17 ENCOUNTER — Ambulatory Visit: Payer: Managed Care, Other (non HMO) | Admitting: Family Medicine

## 2024-02-28 ENCOUNTER — Ambulatory Visit: Admitting: Family Medicine

## 2024-02-28 ENCOUNTER — Encounter: Payer: Self-pay | Admitting: Family Medicine

## 2024-02-28 VITALS — BP 152/90 | HR 66 | Temp 98.2°F | Resp 16 | Ht 62.0 in | Wt 188.7 lb

## 2024-02-28 DIAGNOSIS — E785 Hyperlipidemia, unspecified: Secondary | ICD-10-CM | POA: Diagnosis not present

## 2024-02-28 DIAGNOSIS — E1122 Type 2 diabetes mellitus with diabetic chronic kidney disease: Secondary | ICD-10-CM | POA: Diagnosis not present

## 2024-02-28 DIAGNOSIS — E1169 Type 2 diabetes mellitus with other specified complication: Secondary | ICD-10-CM | POA: Diagnosis not present

## 2024-02-28 DIAGNOSIS — E1129 Type 2 diabetes mellitus with other diabetic kidney complication: Secondary | ICD-10-CM

## 2024-02-28 DIAGNOSIS — R809 Proteinuria, unspecified: Secondary | ICD-10-CM

## 2024-02-28 DIAGNOSIS — E039 Hypothyroidism, unspecified: Secondary | ICD-10-CM

## 2024-02-28 DIAGNOSIS — Z7984 Long term (current) use of oral hypoglycemic drugs: Secondary | ICD-10-CM

## 2024-02-28 DIAGNOSIS — I129 Hypertensive chronic kidney disease with stage 1 through stage 4 chronic kidney disease, or unspecified chronic kidney disease: Secondary | ICD-10-CM

## 2024-02-28 LAB — POCT GLYCOSYLATED HEMOGLOBIN (HGB A1C): Hemoglobin A1C: 6.3 % — AB (ref 4.0–5.6)

## 2024-02-28 MED ORDER — HYDROCHLOROTHIAZIDE 12.5 MG PO TABS: 12.5000 mg | ORAL_TABLET | Freq: Every day | ORAL | 1 refills | Status: DC

## 2024-02-28 MED ORDER — ROSUVASTATIN CALCIUM 40 MG PO TABS: 40.0000 mg | ORAL_TABLET | Freq: Every day | ORAL | 1 refills | Status: DC

## 2024-02-28 MED ORDER — LEVOTHYROXINE SODIUM 50 MCG PO TABS: 50.0000 ug | ORAL_TABLET | Freq: Every day | ORAL | 1 refills | Status: DC

## 2024-02-28 MED ORDER — AMLODIPINE BESYLATE 5 MG PO TABS: 5.0000 mg | ORAL_TABLET | Freq: Every day | ORAL | 0 refills | Status: DC

## 2024-02-28 MED ORDER — RYBELSUS 7 MG PO TABS: 7.0000 mg | ORAL_TABLET | Freq: Every day | ORAL | 1 refills | Status: DC

## 2024-02-28 NOTE — Progress Notes (Signed)
 Name: Sherry Patel   MRN: [id number]    DOB: August 18, 1966   Date:02/28/2024       Progress Note  Subjective  Chief Complaint  Chief Complaint  Patient presents with   Medical Management of Chronic Issues    Patient has not taken all her medicines this past weekend due to forgetting   HPI   DMII: last A1C in Dec 21 was 6.7 % , however well controlled now at 6.3 % taking Rybelsus 7 mg and denies side effects since Summer 2022   She denies polyphagia, polydipsia or polyuria. She has a history of dyslipidemia, obesity and microalbuminuria. She cannot take ACE because of history of angioedema, unable to tolerate Jardiance caused sore throat,. She is on statin for dyslipidemia., LDL was 198 and last time was down to 82 .    Hypothyroidism: she has been taking levothyroxine 50 mcg daily, last TSH was normal  Has had palpitations intermittently for many years - they occur when she's stressed but it is sporadic now - controlled with metoprolol .  Denies change in bowel movements. Mild hair thinning    HTN: BP is really high today, she states she skipped medications over the weekend and had caffeine before coming in to out office. She  cannot take ACE/ARB due to history of angioedema, unable to tolerate Jardiance. We will continue Norvasc, HCTZ  and Metoprolol , she will return in 2 weeks for bp check with CMA and if bp still above 140/90 we will adjust dose of norvasc to 10 mg and return sooner ( 3 months from today for follow up ) other wise keep visit every 6 months    CKD stage I with microalbuminuria: she had  elevated microalbumin in May 2019; kidney function has been stable, denies pruritis. We tried adding Jardiance on her last visit but unable to tolerate, she states it causes sore throat, she stopped and tried again a couple of weeks later and it happened again We are keeping diabetes and bp under control and we will continue to monitor it .  She has good urine output and denies pruritus     Hyperlipidemia: she is back on Crestor and last LDL was good , down over 50 % and stable    Obesity: She states since menopause she had craving carbohydrates. She states weight used to be around 175 lbs but gradually gained weight over the past few years, she was 205 in May 2022 started on Rybelsus and she had  lost 29 lbs, it was down to 176 lbs, however it is gradually going up again. She will try to cut down on portions, also needs to resume regular physical activity   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) 09/25/2015   Hypertensive chronic kidney disease 09/25/2015   Menopause 09/25/2015    Past Surgical History:  Procedure Laterality Date   breast biopy Left 20 years ago   BREAST EXCISIONAL BIOPSY Left    cyst removed    Family History  Problem Relation Age of Onset   Hypertension Mother    Hypertension Maternal Grandmother    Hypertension Maternal Grandfather    Breast cancer Neg Hx     Social History   Tobacco Use   Smoking  status: Former    Current packs/day: 0.00    Average packs/day: 1 pack/day for 4.5 years (4.5 ttl pk-yrs)    Types: Cigarettes    Start date: 01/11/1979    Quit date: 07/15/1983    Years since quitting: 40.6   Smokeless tobacco: Never  Substance Use Topics   Alcohol use: Yes    Comment: occ wine     Current Outpatient Medications:    amLODipine (NORVASC) 5 MG tablet, Take 1 tablet (5 mg total) by mouth daily., Disp: 90 tablet, Rfl: 1   Ascorbic Acid (VITAMIN C) 100 MG tablet, Take 100 mg by mouth daily., Disp: , Rfl:    hydrochlorothiazide (HYDRODIURIL) 12.5 MG tablet, Take 1 tablet (12.5 mg total) by mouth daily., Disp: 90 tablet, Rfl: 1   levothyroxine  (SYNTHROID) 50 MCG tablet, TAKE 1 TABLET(50 MCG) BY MOUTH DAILY BEFORE BREAKFAST, Disp: 30 tablet, Rfl: 0   metoprolol succinate (TOPROL-XL) 100 MG 24 hr tablet, Take 1 tablet (100 mg total) by mouth daily. Take with or immediately following a meal., Disp: 90 tablet, Rfl: 1   Multiple Vitamins-Minerals (MULTIVITAMIN WITH MINERALS) tablet, Take 1 tablet by mouth daily., Disp: , Rfl:    Omega-3 Fatty Acids (FISH OIL) 1000 MG CAPS, Take by mouth., Disp: , Rfl:    rosuvastatin (CRESTOR) 40 MG tablet, Take 1 tablet (40 mg total) by mouth daily., Disp: 90 tablet, Rfl: 1   Semaglutide (RYBELSUS) 7 MG TABS, Take 1 tablet (7 mg total) by mouth daily., Disp: 90 tablet, Rfl: 1  Allergies  Allergen Reactions   Lisinopril Swelling    Angioedema in 2018    I personally reviewed active problem list, medication list, allergies with the patient/caregiver today.   ROS  Ten systems reviewed and is negative except as mentioned in HPI    Objective  Vitals:   02/28/24 0950  BP: (!) 180/102  Pulse: 66  Resp: 16  Temp: 98.2 F (36.8 C)  TempSrc: Oral  SpO2: 100%  Weight: 188 lb 11.2 oz (85.6 kg)  Height: 5\' 2"  (1.575 m)    Body mass index is 34.51 kg/m.  Physical Exam  Constitutional: Patient appears well-developed and well-nourished. Obese  No distress.  HEENT: head atraumatic, normocephalic, pupils equal and reactive to light, neck supple Cardiovascular: Normal rate, regular rhythm and normal heart sounds.  No murmur heard. No BLE edema. Pulmonary/Chest: Effort normal and breath sounds normal. No respiratory distress. Abdominal: Soft.  There is no tenderness. Psychiatric: Patient has a normal mood and affect. behavior is normal. Judgment and thought content normal.   Recent Results (from the past 2160 hours)  Cytology - PAP     Status: None   Collection Time: 12/01/23  8:37 AM  Result Value Ref Range   High risk HPV Negative    Adequacy      Satisfactory for evaluation; transformation  zone component PRESENT.   Diagnosis      - Negative for intraepithelial lesion or malignancy (NILM)   Microorganisms      Fungal organisms present consistent with Candida spp.   Microorganisms Shift in flora suggestive of bacterial vaginosis    Comment Normal Reference Range HPV - Negative   Cologuard     Status: None   Collection Time: 12/09/23 12:20 PM  Result Value Ref Range   COLOGUARD Negative Negative    Comment: NEGATIVE TEST RESULT. A negative Cologuard result indicates a low likelihood that a colorectal cancer (CRC) or advanced adenoma (adenomatous polyps with more  advanced pre-malignant features)  is present. The chance that a person with a negative Cologuard test has a colorectal cancer is less than 1 in 1500 (negative predictive value >99.9%) or has an  advanced adenoma is less than  5.3% (negative predictive value 94.7%). These data are based on a prospective cross-sectional study of 10,000 individuals at average risk for colorectal cancer who were screened with both Cologuard and colonoscopy. (Imperiale T. et al, N Engl J Med 2014;370(14):1286-1297) The normal value (reference range) for this assay is negative.  COLOGUARD RE-SCREENING RECOMMENDATION: Periodic colorectal cancer screening is an important part of preventive healthcare for asymptomatic individuals at average risk for colorectal cancer.  Following a negative Cologuard result, the American Cancer Society and U.S.  Multi-Society Task Force screening guidelines recommend a Cologuard re-screening interval of 3 years.  References: American Cancer Society Guideline for Colorectal Cancer Screening: https://www.cancer.org/cancer/colon-rectal-cancer/detection-diagnosis-staging/acs-recommendations.html.; Rex DK, Boland CR, Dominitz JK, Colorectal Cancer Screening: Recommendations for Physicians and Patients from the U.S. Multi-Society Task Force on Colorectal Cancer Screening , Am J Gastroenterology 2017; 112:1016-1030.  TEST  DESCRIPTION: Composite algorithmic analysis of stool DNA-biomarkers with hemoglobin immunoassay.   Quantitative values of individual biomarkers are not reportable and are not associated with individual biomarker result reference ranges. Cologuard is intended for colorectal cancer screening of adults of either sex, 45 years or older, who are at average-risk for colorectal cancer (CRC). Cologuard has been approved for use by the U.S. FDA. The performance of Cologuard was  established in a cross sectional study of average-risk adults aged 26-84. Cologuard performance in patients ages 44 to 82 years was estimated by sub-group analysis of near-age groups. Colonoscopies performed for a positive result may find as the most clinically significant lesion: colorectal cancer [4.0%], advanced adenoma (including sessile serrated polyps greater than or equal to 1cm diameter) [20%] or non- advanced adenoma [31%]; or no colorectal neoplasia [45%]. These estimates are derived from a prospective cross-sectional screening study of 10,000 individuals at average risk for colorectal cancer who were screened with both Cologuard and colonoscopy. (Imperiale T. et al, Macy Mis J Med 2014;370(14):1286-1297.) Cologuard may produce a false negative or false positive result (no colorectal cancer or precancerous polyp present at colonoscopy follow up). A negative Cologuard test result does not guarantee the absence of CRC or advanced adenoma (pre-cancer). The current Cologuard  screening interval is every 3 years. Science writer and [place] Therapist, music). Cologuard performance data in a 10,000 patient pivotal study using colonoscopy as the reference method can be accessed at the following location: www.exactlabs.com/results. Additional description of the Cologuard test process, warnings and precautions can be found at www.cologuard.com.   POCT glycosylated hemoglobin (Hb A1C)     Status: Abnormal   Collection Time: 02/28/24   9:55 AM  Result Value Ref Range   Hemoglobin A1C 6.3 (A) 4.0 - 5.6 %   HbA1c POC (<> result, manual entry)     HbA1c, POC (prediabetic range)     HbA1c, POC (controlled diabetic range)      Diabetic Foot Exam:     PHQ2/9:    02/28/2024    9:50 AM 08/10/2023    9:14 AM 03/10/2023   10:56 AM 11/26/2022   10:28 AM 08/05/2022   11:16 AM  Depression screen PHQ 2/9  Decreased Interest 0 0 0 0 0  Down, Depressed, Hopeless 0 0 1 0 0  PHQ - 2 Score 0 0 1 0 0  Altered sleeping 0 0 0 0 0  Tired, decreased  energy 0 0 0 0 0  Change in appetite 0 0 0 0 0  Feeling bad or failure about yourself  0 0 0 0 0  Trouble concentrating 0 0 0 0 0  Moving slowly or fidgety/restless 0 0 0 0 0  Suicidal thoughts 0 0 0 0 0  PHQ-9 Score 0 0 1 0 0  Difficult doing work/chores Not difficult at all        phq 9 is negative  Fall Risk:    12/01/2023    8:13 AM 08/10/2023    9:14 AM 03/10/2023   10:56 AM 11/26/2022   10:29 AM 08/05/2022   11:16 AM  Fall Risk   Falls in the past year? 0 0 0 0 0  Number falls in past yr: 0  0  0  Injury with Fall? 0  0  0  Risk for fall due to : No Fall Risks No Fall Risks No Fall Risks No Fall Risks No Fall Risks  Follow up Falls prevention discussed;Education provided;Falls evaluation completed Falls prevention discussed Falls prevention discussed Falls prevention discussed;Education provided;Falls evaluation completed Falls prevention discussed     Assessment & Plan

## 2024-03-02 ENCOUNTER — Telehealth: Payer: Self-pay

## 2024-03-02 NOTE — Telephone Encounter (Signed)
 Cover my meds prior auth on   Semaglutide (RYBELSUS) 7 MG TABS   [facility]

## 2024-03-02 NOTE — Telephone Encounter (Signed)
 PA done waiting on insurance to determine

## 2024-03-09 NOTE — Telephone Encounter (Signed)
 Copied from CRM [id number]. Topic: Clinical - Prescription Issue >> Mar 09, 2024  1:51 PM Dondra Prader E wrote: Reason for CRM: Pt called to report that she cannot afford the Semaglutide (RYBELSUS) 7 MG TABS. She is seeking an alternative that she can afford.   Best contact: [phone]

## 2024-03-10 NOTE — Telephone Encounter (Signed)
 Request Reference Number: [id number]. RYBELSUS TAB 7MG  is approved through 03/02/2025. Your patient may now fill this prescription and it will be covered.. Authorization Expiration Date: March 02, 2025.

## 2024-03-10 NOTE — Telephone Encounter (Signed)
 Can u check on PA? I see documented you started it, but I dont see anything scanned or on cover my meds?  But I dont think I can see on cover my meds unless I did it?

## 2024-03-13 ENCOUNTER — Ambulatory Visit

## 2024-03-13 VITALS — BP 128/70

## 2024-03-13 DIAGNOSIS — E1122 Type 2 diabetes mellitus with diabetic chronic kidney disease: Secondary | ICD-10-CM

## 2024-03-13 DIAGNOSIS — Z0130 Encounter for examination of blood pressure without abnormal findings: Secondary | ICD-10-CM

## 2024-03-13 NOTE — Progress Notes (Signed)
 Patient is in office today for a nurse visit for Blood Pressure Check. Patient blood pressure was 128/70, Patient No chest pain, No shortness of breath, No dyspnea on exertion

## 2024-06-06 ENCOUNTER — Other Ambulatory Visit: Payer: Self-pay | Admitting: Family Medicine

## 2024-06-06 DIAGNOSIS — E1122 Type 2 diabetes mellitus with diabetic chronic kidney disease: Secondary | ICD-10-CM

## 2024-06-09 ENCOUNTER — Encounter: Payer: Self-pay | Admitting: Family Medicine

## 2024-07-03 ENCOUNTER — Ambulatory Visit: Admitting: Family Medicine

## 2024-07-03 ENCOUNTER — Encounter: Payer: Self-pay | Admitting: Family Medicine

## 2024-07-03 VITALS — BP 126/74 | HR 73 | Resp 16 | Ht 62.0 in | Wt 188.6 lb

## 2024-07-03 DIAGNOSIS — E1122 Type 2 diabetes mellitus with diabetic chronic kidney disease: Secondary | ICD-10-CM | POA: Diagnosis not present

## 2024-07-03 DIAGNOSIS — E039 Hypothyroidism, unspecified: Secondary | ICD-10-CM

## 2024-07-03 DIAGNOSIS — E559 Vitamin D deficiency, unspecified: Secondary | ICD-10-CM | POA: Diagnosis not present

## 2024-07-03 DIAGNOSIS — E785 Hyperlipidemia, unspecified: Secondary | ICD-10-CM

## 2024-07-03 DIAGNOSIS — I129 Hypertensive chronic kidney disease with stage 1 through stage 4 chronic kidney disease, or unspecified chronic kidney disease: Secondary | ICD-10-CM

## 2024-07-03 DIAGNOSIS — E1169 Type 2 diabetes mellitus with other specified complication: Secondary | ICD-10-CM

## 2024-07-03 DIAGNOSIS — R809 Proteinuria, unspecified: Secondary | ICD-10-CM

## 2024-07-03 LAB — POCT GLYCOSYLATED HEMOGLOBIN (HGB A1C): Hemoglobin A1C: 6.2 % — AB (ref 4.0–5.6)

## 2024-07-03 MED ORDER — METFORMIN HCL ER 750 MG PO TB24: 1500.0000 mg | ORAL_TABLET | Freq: Every day | ORAL | 1 refills | Status: AC

## 2024-07-03 MED ORDER — ROSUVASTATIN CALCIUM 40 MG PO TABS: 40.0000 mg | ORAL_TABLET | Freq: Every day | ORAL | 1 refills | Status: AC

## 2024-07-03 MED ORDER — HYDROCHLOROTHIAZIDE 12.5 MG PO TABS: 12.5000 mg | ORAL_TABLET | Freq: Every day | ORAL | 1 refills | Status: AC

## 2024-07-03 MED ORDER — AMLODIPINE BESYLATE 5 MG PO TABS: 5.0000 mg | ORAL_TABLET | Freq: Every day | ORAL | 0 refills | Status: DC

## 2024-07-03 MED ORDER — LEVOTHYROXINE SODIUM 50 MCG PO TABS: 50.0000 ug | ORAL_TABLET | Freq: Every day | ORAL | 1 refills | Status: AC

## 2024-07-03 MED ORDER — METOPROLOL SUCCINATE ER 100 MG PO TB24: 100.0000 mg | ORAL_TABLET | Freq: Every day | ORAL | 1 refills | Status: AC

## 2024-07-03 NOTE — Progress Notes (Signed)
 Name: Sherry Patel   MRN: [id number]    DOB: December 29, 1965   Date:07/03/2024       Progress Note  Subjective  Chief Complaint  Chief Complaint  Patient presents with   Med Change Request    Rybeslus   Discussed the use of AI scribe software for clinical note transcription with the patient, who gave verbal consent to proceed.  History of Present Illness Sherry Patel is a 58 year old female with diabetes who presents for medication management due to high costs.  She is experiencing financial difficulties with her diabetes medication, Rybelsus , due to a high deductible insurance plan. She has been managing her diabetes with Rybelsus , taking two tablets a day, but the expense has become prohibitive. Her last A1c was 6.2, consistent with her previous result of 6.3 four months ago. She received a 15-day sample supply to help manage costs.  She has a history of hypertensive chronic kidney disease and is currently taking amlodipine  5 mg, HCTZ 12.5 mg, and metoprolol  100 mg for blood pressure management. She experiences occasional palpitations lasting seconds but has no chest pain or significant shortness of breath.  She has dyslipidemia associated with diabetes and is taking rosuvastatin  40 mg. Her LDL is 82, which is slightly above the target. She has no muscle aches from the medication.  She has hypothyroidism and reports some hair loss but no constipation. She is taking medication for this condition.  She has a history of protein microalbuminuria, but her protein levels in urine have normalized. She is allergic to lisinopril, which previously caused angioedema.  She is taking a vitamin D  supplement for vitamin D  deficiency and denies any side effects from her current medications.  She has not had an eye exam in the past year and is using old glasses.    Patient Active Problem List   Diagnosis Date Noted   Dyslipidemia associated with type 2 diabetes mellitus (HCC) 07/25/2021   Pure  hypercholesterolemia 12/04/2019   Microalbuminuria 05/23/2019   Acquired hypothyroidism 12/31/2015   Hypertension associated with chronic kidney disease due to type 2 diabetes mellitus (HCC) 09/25/2015   Hypertensive chronic kidney disease 09/25/2015   Menopause 09/25/2015    Past Surgical History:  Procedure Laterality Date   breast biopy Left 20 years ago   BREAST EXCISIONAL BIOPSY Left    cyst removed    Family History  Problem Relation Age of Onset   Hypertension Mother    Hypertension Maternal Grandmother    Hypertension Maternal Grandfather    Breast cancer Neg Hx     Social History   Tobacco Use   Smoking status: Former    Current packs/day: 0.00    Average packs/day: 1 pack/day for 4.5 years (4.5 ttl pk-yrs)    Types: Cigarettes    Start date: 01/11/1979    Quit date: 07/15/1983    Years since quitting: 40.9   Smokeless tobacco: Never  Substance Use Topics   Alcohol use: Yes    Comment: occ wine     Current Outpatient Medications:    amLODipine  (NORVASC ) 5 MG tablet, TAKE 1 TABLET(5 MG) BY MOUTH DAILY, Disp: 90 tablet, Rfl: 0   Ascorbic Acid (VITAMIN C) 100 MG tablet, Take 100 mg by mouth daily., Disp: , Rfl:    hydrochlorothiazide  (HYDRODIURIL ) 12.5 MG tablet, Take 1 tablet (12.5 mg total) by mouth daily., Disp: 90 tablet, Rfl: 1   levothyroxine  (SYNTHROID ) 50 MCG tablet, Take 1 tablet (50 mcg total) by mouth daily  before breakfast., Disp: 90 tablet, Rfl: 1   metoprolol  succinate (TOPROL -XL) 100 MG 24 hr tablet, Take 1 tablet (100 mg total) by mouth daily. Take with or immediately following a meal., Disp: 90 tablet, Rfl: 1   Multiple Vitamins-Minerals (MULTIVITAMIN WITH MINERALS) tablet, Take 1 tablet by mouth daily., Disp: , Rfl:    Omega-3 Fatty Acids (FISH OIL) 1000 MG CAPS, Take by mouth., Disp: , Rfl:    rosuvastatin  (CRESTOR ) 40 MG tablet, Take 1 tablet (40 mg total) by mouth daily., Disp: 90 tablet, Rfl: 1   Semaglutide  (RYBELSUS ) 7 MG TABS, Take 1 tablet  (7 mg total) by mouth daily., Disp: 90 tablet, Rfl: 1  Allergies  Allergen Reactions   Lisinopril Swelling    Angioedema in 2018    I personally reviewed active problem list, medication list, allergies with the patient/caregiver today.   ROS  Ten systems reviewed and is negative except as mentioned in HPI    Objective Physical Exam  CONSTITUTIONAL: Patient appears well-developed and well-nourished. No distress. HEENT: Head atraumatic, normocephalic, neck supple. CARDIOVASCULAR: Normal rate, regular rhythm and normal heart sounds. No murmur heard. No BLE edema. Extremities normal. PULMONARY: Effort normal and breath sounds normal. No respiratory distress. ABDOMINAL: There is no tenderness or distention. MUSCULOSKELETAL: Normal gait. Without gross motor or sensory deficit. PSYCHIATRIC: Patient has a normal mood and affect. Behavior is normal. Judgment and thought content normal. NEUROLOGICAL: Sensation intact.  Vitals:   07/03/24 0903  BP: 128/82  Pulse: 73  Resp: 16  SpO2: 99%  Weight: 188 lb 9.6 oz (85.5 kg)  Height: 5' 2 (1.575 m)    Body mass index is 34.5 kg/m.  Recent Results (from the past 2160 hours)  POCT glycosylated hemoglobin (Hb A1C)     Status: Abnormal   Collection Time: 07/03/24  9:08 AM  Result Value Ref Range   Hemoglobin A1C 6.2 (A) 4.0 - 5.6 %   HbA1c POC (<> result, manual entry)     HbA1c, POC (prediabetic range)     HbA1c, POC (controlled diabetic range)      Diabetic Foot Exam:  Diabetic foot exam was performed with the following findings:   No deformities, ulcerations, or other skin breakdown Normal sensation of 10g monofilament Intact posterior tibialis and dorsalis pedis pulses      PHQ2/9:    07/03/2024    8:58 AM 02/28/2024    9:50 AM 08/10/2023    9:14 AM 03/10/2023   10:56 AM 11/26/2022   10:28 AM  Depression screen PHQ 2/9  Decreased Interest 0 0 0 0 0  Down, Depressed, Hopeless 0 0 0 1 0  PHQ - 2 Score 0 0 0 1 0   Altered sleeping  0 0 0 0  Tired, decreased energy  0 0 0 0  Change in appetite  0 0 0 0  Feeling bad or failure about yourself   0 0 0 0  Trouble concentrating  0 0 0 0  Moving slowly or fidgety/restless  0 0 0 0  Suicidal thoughts  0 0 0 0  PHQ-9 Score  0 0 1 0  Difficult doing work/chores  Not difficult at all       phq 9 is negative  Fall Risk:    07/03/2024    8:58 AM 12/01/2023    8:13 AM 08/10/2023    9:14 AM 03/10/2023   10:56 AM 11/26/2022   10:29 AM  Fall Risk   Falls in the past year? 0  0 0 0 0  Number falls in past yr: 0 0  0   Injury with Fall? 0 0  0   Risk for fall due to : No Fall Risks No Fall Risks No Fall Risks No Fall Risks No Fall Risks  Follow up Falls evaluation completed Falls prevention discussed;Education provided;Falls evaluation completed Falls prevention discussed Falls prevention discussed Falls prevention discussed;Education provided;Falls evaluation completed      Data saved with a previous flowsheet row definition      Assessment & Plan Type 2 diabetes mellitus Type 2 diabetes mellitus with well-controlled A1c, decreased from 6.3% to 6.2% over four months. - Discontinue Rybelsus  due to cost. - Initiate metformin  750 mg, starting with one tablet daily for one week, then increase to two tablets daily as tolerated. - Educated on holding metformin  during dehydration or illness to prevent kidney damage. - Schedule follow-up in four months to assess response to medication change.  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 No current medication for kidney protection due to lisinopril allergy causing angioedema. Proteinuria has normalized. - Continue amlodipine  5 mg, HCTZ 12.5 mg, and metoprolol  100 mg. - Monitor blood pressure regularly. - Check blood pressure again before leaving the office.  Dyslipidemia Dyslipidemia  with LDL at 82 mg/dL, below target but above optimal level. Managed with rosuvastatin  40 mg. No muscle aches reported. - Continue rosuvastatin  40 mg. - Emphasize diet and exercise for further lipid control.  Hypothyroidism Hypothyroidism with no significant symptoms. Mild hair loss likely related to postmenopausal status.  Vitamin D  deficiency Vitamin D  deficiency managed with supplementation.

## 2024-08-31 ENCOUNTER — Ambulatory Visit: Admitting: Family Medicine

## 2024-09-06 ENCOUNTER — Other Ambulatory Visit: Payer: Self-pay | Admitting: Family Medicine

## 2024-09-06 ENCOUNTER — Encounter: Payer: Self-pay | Admitting: Family Medicine

## 2024-09-06 DIAGNOSIS — E785 Hyperlipidemia, unspecified: Secondary | ICD-10-CM

## 2024-09-06 DIAGNOSIS — E1169 Type 2 diabetes mellitus with other specified complication: Secondary | ICD-10-CM

## 2024-09-13 ENCOUNTER — Encounter: Payer: Self-pay | Admitting: Family Medicine

## 2024-09-27 ENCOUNTER — Encounter: Payer: Self-pay | Admitting: Family Medicine

## 2024-09-27 ENCOUNTER — Other Ambulatory Visit: Payer: Self-pay | Admitting: Family Medicine

## 2024-09-27 DIAGNOSIS — Z1231 Encounter for screening mammogram for malignant neoplasm of breast: Secondary | ICD-10-CM

## 2024-10-24 ENCOUNTER — Ambulatory Visit

## 2024-10-25 ENCOUNTER — Ambulatory Visit (INDEPENDENT_AMBULATORY_CARE_PROVIDER_SITE_OTHER)

## 2024-10-25 DIAGNOSIS — Z23 Encounter for immunization: Secondary | ICD-10-CM | POA: Diagnosis not present

## 2024-10-30 ENCOUNTER — Ambulatory Visit
Admission: RE | Admit: 2024-10-30 | Discharge: 2024-10-30 | Disposition: A | Discharge status: Home / Self Care | Source: Ambulatory Visit | Attending: Family Medicine | Admitting: Family Medicine

## 2024-10-30 DIAGNOSIS — Z1231 Encounter for screening mammogram for malignant neoplasm of breast: Secondary | ICD-10-CM | POA: Insufficient documentation

## 2024-11-01 ENCOUNTER — Ambulatory Visit: Payer: Self-pay | Admitting: Family Medicine

## 2024-11-01 ENCOUNTER — Other Ambulatory Visit: Payer: Self-pay | Admitting: Family Medicine

## 2024-11-01 DIAGNOSIS — R928 Other abnormal and inconclusive findings on diagnostic imaging of breast: Secondary | ICD-10-CM

## 2024-11-02 ENCOUNTER — Inpatient Hospital Stay: Admission: RE | Admit: 2024-11-02 | Source: Ambulatory Visit

## 2024-11-03 ENCOUNTER — Ambulatory Visit: Admitting: Family Medicine

## 2024-12-01 NOTE — Patient Instructions (Incomplete)
 Preventive Care 58-60 Years Old, Female  Preventive care refers to lifestyle choices and visits with your health care provider that can promote health and wellness. Preventive care visits are also called wellness exams.  What can I expect for my preventive care visit?  Counseling  Your health care provider may ask you questions about your:  Medical history, including:  Past medical problems.  Family medical history.  Pregnancy history.  Current health, including:  Menstrual cycle.  Method of birth control.  Emotional well-being.  Home life and relationship well-being.  Sexual activity and sexual health.  Lifestyle, including:  Alcohol, nicotine or tobacco, and drug use.  Access to firearms.  Diet, exercise, and sleep habits.  Work and work Astronomer.  Sunscreen use.  Safety issues such as seatbelt and bike helmet use.  Physical exam  Your health care provider will check your:  Height and weight. These may be used to calculate your BMI (body mass index). BMI is a measurement that tells if you are at a healthy weight.  Waist circumference. This measures the distance around your waistline. This measurement also tells if you are at a healthy weight and may help predict your risk of certain diseases, such as type 2 diabetes and high blood pressure.  Heart rate and blood pressure.  Body temperature.  Skin for abnormal spots.  What immunizations do I need?    Vaccines are usually given at various ages, according to a schedule. Your health care provider will recommend vaccines for you based on your age, medical history, and lifestyle or other factors, such as travel or where you work.  What tests do I need?  Screening  Your health care provider may recommend screening tests for certain conditions. This may include:  Lipid and cholesterol levels.  Diabetes screening. This is done by checking your blood sugar (glucose) after you have not eaten for a while (fasting).  Pelvic exam and Pap test.  Hepatitis B test.  Hepatitis C  test.  HIV (human immunodeficiency virus) test.  STI (sexually transmitted infection) testing, if you are at risk.  Lung cancer screening.  Colorectal cancer screening.  Mammogram. Talk with your health care provider about when you should start having regular mammograms. This may depend on whether you have a family history of breast cancer.  BRCA-related cancer screening. This may be done if you have a family history of breast, ovarian, tubal, or peritoneal cancers.  Bone density scan. This is done to screen for osteoporosis.  Talk with your health care provider about your test results, treatment options, and if necessary, the need for more tests.  Follow these instructions at home:  Eating and drinking    Eat a diet that includes fresh fruits and vegetables, whole grains, lean protein, and low-fat dairy products.  Take vitamin and mineral supplements as recommended by your health care provider.  Do not drink alcohol if:  Your health care provider tells you not to drink.  You are pregnant, may be pregnant, or are planning to become pregnant.  If you drink alcohol:  Limit how much you have to 0-1 drink a day.  Know how much alcohol is in your drink. In the [place] one drink equals one 12 oz bottle of beer (355 mL), one 5 oz glass of wine (148 mL), or one 1 oz glass of hard liquor (44 mL).  Lifestyle  Brush your teeth every morning and night with fluoride toothpaste. Floss one time each day.  Exercise for at least  30 minutes 5 or more days each week.  Do not use any products that contain nicotine or tobacco. These products include cigarettes, chewing tobacco, and vaping devices, such as e-cigarettes. If you need help quitting, ask your health care provider.  Do not use drugs.  If you are sexually active, practice safe sex. Use a condom or other form of protection to prevent STIs.  If you do not wish to become pregnant, use a form of birth control. If you plan to become pregnant, see your health care provider for a  prepregnancy visit.  Take aspirin only as told by your health care provider. Make sure that you understand how much to take and what form to take. Work with your health care provider to find out whether it is safe and beneficial for you to take aspirin daily.  Find healthy ways to manage stress, such as:  Meditation, yoga, or listening to music.  Journaling.  Talking to a trusted person.  Spending time with friends and family.  Minimize exposure to UV radiation to reduce your risk of skin cancer.  Safety  Always wear your seat belt while driving or riding in a vehicle.  Do not drive:  If you have been drinking alcohol. Do not ride with someone who has been drinking.  When you are tired or distracted.  While texting.  If you have been using any mind-altering substances or drugs.  Wear a helmet and other protective equipment during sports activities.  If you have firearms in your house, make sure you follow all gun safety procedures.  Seek help if you have been physically or sexually abused.  What's next?  Visit your health care provider once a year for an annual wellness visit.  Ask your health care provider how often you should have your eyes and teeth checked.  Stay up to date on all vaccines.  This information is not intended to replace advice given to you by your health care provider. Make sure you discuss any questions you have with your health care provider.  Document Revised: 05/28/2021 Document Reviewed: 05/28/2021  Elsevier Patient Education  2024 [employer].

## 2024-12-04 ENCOUNTER — Encounter: Admitting: Family Medicine

## 2024-12-04 ENCOUNTER — Other Ambulatory Visit: Payer: Self-pay | Admitting: Family Medicine

## 2024-12-04 DIAGNOSIS — I129 Hypertensive chronic kidney disease with stage 1 through stage 4 chronic kidney disease, or unspecified chronic kidney disease: Secondary | ICD-10-CM

## 2024-12-06 NOTE — Telephone Encounter (Signed)
 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
